# Patient Record
Sex: Female | Born: 1985
Health system: Southern US, Community
[De-identification: ages and names within clinical notes are randomized; demographics above are authoritative.]

## PROBLEM LIST (undated history)

## (undated) DIAGNOSIS — I10 Essential (primary) hypertension: Secondary | ICD-10-CM

---
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
## 2007-04-02 ENCOUNTER — Ambulatory Visit: Payer: Self-pay

## 2007-05-23 ENCOUNTER — Ambulatory Visit: Payer: Self-pay | Admitting: Obstetrics and Gynecology

## 2007-06-16 ENCOUNTER — Emergency Department: Payer: Self-pay | Admitting: Emergency Medicine

## 2007-06-16 ENCOUNTER — Other Ambulatory Visit: Payer: Self-pay

## 2007-09-04 ENCOUNTER — Inpatient Hospital Stay: Payer: Self-pay | Admitting: Psychiatry

## 2007-09-06 ENCOUNTER — Other Ambulatory Visit: Payer: Self-pay

## 2008-05-11 ENCOUNTER — Emergency Department: Payer: Self-pay | Admitting: Emergency Medicine

## 2008-05-21 ENCOUNTER — Emergency Department: Payer: Self-pay | Admitting: Emergency Medicine

## 2008-05-30 ENCOUNTER — Emergency Department: Payer: Self-pay | Admitting: Emergency Medicine

## 2008-07-22 ENCOUNTER — Ambulatory Visit: Payer: Self-pay

## 2009-06-28 ENCOUNTER — Emergency Department: Payer: Self-pay | Admitting: Emergency Medicine

## 2009-07-05 ENCOUNTER — Emergency Department: Payer: Self-pay | Admitting: Emergency Medicine

## 2014-06-06 ENCOUNTER — Emergency Department: Payer: Self-pay | Admitting: Emergency Medicine

## 2014-06-06 LAB — COMPREHENSIVE METABOLIC PANEL
AST: 18 U/L (ref 15–37)
Albumin: 3.2 g/dL — ABNORMAL LOW (ref 3.4–5.0)
Alkaline Phosphatase: 95 U/L
Anion Gap: 7 (ref 7–16)
BILIRUBIN TOTAL: 0.4 mg/dL (ref 0.2–1.0)
BUN: 9 mg/dL (ref 7–18)
CHLORIDE: 107 mmol/L (ref 98–107)
CO2: 24 mmol/L (ref 21–32)
CREATININE: 0.79 mg/dL (ref 0.60–1.30)
Calcium, Total: 8.2 mg/dL — ABNORMAL LOW (ref 8.5–10.1)
EGFR (African American): >60
EGFR (Non-African Amer.): >60
GLUCOSE: 93 mg/dL (ref 65–99)
Osmolality: 274 (ref 275–301)
Potassium: 3.8 mmol/L (ref 3.5–5.1)
SGPT (ALT): 18 U/L
Sodium: 138 mmol/L (ref 136–145)
TOTAL PROTEIN: 7.6 g/dL (ref 6.4–8.2)

## 2014-06-06 LAB — CBC WITH DIFFERENTIAL/PLATELET
BASOS ABS: 0.1 10*3/uL (ref 0.0–0.1)
Basophil %: 1 %
Eosinophil #: 0.3 10*3/uL (ref 0.0–0.7)
Eosinophil %: 3.4 %
HCT: 40.5 % (ref 35.0–47.0)
HGB: 13 g/dL (ref 12.0–16.0)
Lymphocyte #: 2.3 10*3/uL (ref 1.0–3.6)
Lymphocyte %: 24.5 %
MCH: 24.9 pg — ABNORMAL LOW (ref 26.0–34.0)
MCHC: 32 g/dL (ref 32.0–36.0)
MCV: 78 fL — AB (ref 80–100)
MONOS PCT: 7.6 %
Monocyte #: 0.7 x10 3/mm (ref 0.2–0.9)
Neutrophil #: 6 10*3/uL (ref 1.4–6.5)
Neutrophil %: 63.5 %
PLATELETS: 324 10*3/uL (ref 150–440)
RBC: 5.21 10*6/uL — ABNORMAL HIGH (ref 3.80–5.20)
RDW: 14.7 % — AB (ref 11.5–14.5)
WBC: 9.5 10*3/uL (ref 3.6–11.0)

## 2014-06-06 LAB — TROPONIN I

## 2014-06-06 LAB — URINALYSIS, COMPLETE
BILIRUBIN, UR: NEGATIVE
Glucose,UR: NEGATIVE mg/dL (ref 0–75)
Ketone: NEGATIVE
LEUKOCYTE ESTERASE: NEGATIVE
Nitrite: NEGATIVE
PH: 7 (ref 4.5–8.0)
PROTEIN: NEGATIVE
RBC,UR: 2 /HPF (ref 0–5)
Specific Gravity: 1.015 (ref 1.003–1.030)
Squamous Epithelial: 9
WBC UR: 1 /HPF (ref 0–5)

## 2015-01-19 ENCOUNTER — Emergency Department
Admission: EM | Admit: 2015-01-19 | Discharge: 2015-01-19 | Disposition: A | Discharge status: Home / Self Care | Payer: Self-pay | Attending: Emergency Medicine | Admitting: Emergency Medicine

## 2015-01-19 ENCOUNTER — Emergency Department: Payer: Self-pay

## 2015-01-19 ENCOUNTER — Encounter: Payer: Self-pay | Admitting: Urgent Care

## 2015-01-19 DIAGNOSIS — Z720 Tobacco use: Secondary | ICD-10-CM | POA: Insufficient documentation

## 2015-01-19 DIAGNOSIS — Z3202 Encounter for pregnancy test, result negative: Secondary | ICD-10-CM | POA: Insufficient documentation

## 2015-01-19 DIAGNOSIS — M545 Low back pain, unspecified: Secondary | ICD-10-CM

## 2015-01-19 HISTORY — DX: Essential (primary) hypertension: I10

## 2015-01-19 LAB — URINALYSIS COMPLETE WITH MICROSCOPIC (ARMC ONLY)
Bilirubin Urine: NEGATIVE
Glucose, UA: NEGATIVE mg/dL
Hgb urine dipstick: NEGATIVE
Ketones, ur: NEGATIVE mg/dL
LEUKOCYTES UA: NEGATIVE
Nitrite: NEGATIVE
PH: 5 (ref 5.0–8.0)
PROTEIN: 30 mg/dL — AB
Specific Gravity, Urine: 1.029 (ref 1.005–1.030)

## 2015-01-19 LAB — POCT PREGNANCY, URINE: PREG TEST UR: NEGATIVE

## 2015-01-19 MED ORDER — KETOROLAC TROMETHAMINE 60 MG/2ML IM SOLN
60.0000 mg | Freq: Once | INTRAMUSCULAR | Status: AC
  Administered 2015-01-19: 60 mg via INTRAMUSCULAR

## 2015-01-19 MED ORDER — KETOROLAC TROMETHAMINE 60 MG/2ML IM SOLN
INTRAMUSCULAR | Status: AC
  Administered 2015-01-19: 60 mg via INTRAMUSCULAR
  Filled 2015-01-19: qty 2

## 2015-01-19 MED ORDER — CYCLOBENZAPRINE HCL 10 MG PO TABS: 10.0000 mg | ORAL_TABLET | Freq: Three times a day (TID) | ORAL | Status: AC | PRN

## 2015-01-19 NOTE — ED Notes (Signed)
Patient presents with complaining of left lower back pain with radiation into flank and groin area. Denies dysuria or hematuria.  Pt reports having recurrent UTIs and these symptoms feel similar. Pt is AOx4 in no apparent distress.

## 2015-01-19 NOTE — ED Provider Notes (Signed)
[facility] Emergency Department Provider Note  ____________________________________________  Time seen: Approximately 506 AM  I have reviewed the triage vital signs and the nursing notes.   HISTORY  Chief Complaint Back Pain    HPI ALMYRA BIRMAN is a 29 y.o. female comes in with back pain for 2 days. The patient reports that his lower back and seems to radiate up into her left side. The patient reports that she thinks she has a kidney infection because the time she's had this in the past was with a kidney infection. The patient has not been taking anything for pain because she does not like medicine. The patient denies pain with urination and reports her pain is 8 out of 10 in intensity. Her ports that she does not think that this pain is something that can be treated with ibuprofen so she has not tried any ibuprofen for pain. The patient came in for further evaluation.   Past Medical History  Diagnosis Date  . Hypertension     There are no active problems to display for this patient.   History reviewed. No pertinent past surgical history.  Current Outpatient Rx  Name  Route  Sig  Dispense  Refill  . cyclobenzaprine (FLEXERIL) 10 MG tablet   Oral   Take 1 tablet (10 mg total) by mouth every 8 (eight) hours as needed for muscle spasms.   15 tablet   0     Allergies Review of patient's allergies indicates no known allergies.  No family history on file.  Social History History  Substance Use Topics  . Smoking status: Current Every Day Smoker  . Smokeless tobacco: Not on file  . Alcohol Use: Yes    Review of Systems Constitutional: No fever/chills Eyes: No visual changes. ENT: No sore throat. Cardiovascular: Denies chest pain. Respiratory: Denies shortness of breath. Gastrointestinal: No abdominal pain.  No nausea, no vomiting.   Genitourinary: Negative for dysuria. Musculoskeletal: back pain. Skin: Negative for rash. Neurological:  Negative for headaches,  10-point ROS otherwise negative.  ____________________________________________   PHYSICAL EXAM:  VITAL SIGNS: ED Triage Vitals  Enc Vitals Group     BP 01/19/15 0113 133/88 mmHg     Pulse Rate 01/19/15 0113 98     Resp 01/19/15 0113 18     Temp 01/19/15 0113 98.3 F (36.8 C)     Temp Source 01/19/15 0113 Oral     SpO2 01/19/15 0113 98 %     Weight --      Height 01/19/15 0113  (1.575 m)     Head Cir --      Peak Flow --      Pain Score 01/19/15 0113 6     Pain Loc --      Pain Edu? --      Excl. in GC? --     Constitutional: Alert and oriented. Well appearing and in mild distress. Eyes: Conjunctivae are normal. PERRL. EOMI. Head: Atraumatic. Nose: No congestion/rhinnorhea. Mouth/Throat: Mucous membranes are moist.  Oropharynx non-erythematous. Cardiovascular: Normal rate, regular rhythm. Grossly normal heart sounds.  Good peripheral circulation. Respiratory: Normal respiratory effort.  No retractions. Lungs CTAB. Gastrointestinal: Soft and nontender. No distention. Positive bowel sounds left-sided CVA tenderness palpation. Genitourinary: Deferred Musculoskeletal: No lower extremity tenderness nor edema.   Neurologic:  Normal speech and language. No gross focal neurologic deficits are appreciated.  Skin:  Skin is warm, dry and intact. No rash noted. Psychiatric: Mood and affect are normal.  Speech and behavior are normal.  ____________________________________________   LABS (all labs ordered are listed, but only abnormal results are displayed)  Labs Reviewed  URINALYSIS COMPLETEWITH MICROSCOPIC ([facility] ONLY) - Abnormal; Notable for the following:    Color, Urine YELLOW (*)    APPearance CLEAR (*)    Protein, ur 30 (*)    Bacteria, UA RARE (*)    Squamous Epithelial / LPF 0-5 (*)    All other components within normal limits  URINE CULTURE  POC URINE PREG, ED  POCT PREGNANCY, URINE    ____________________________________________  EKG  None ____________________________________________  RADIOLOGY  CT scan abdomen and pelvis stone protocol: No renal or ureteral stone or obstruction ____________________________________________   PROCEDURES  Procedure(s) performed: None  Critical Care performed: No  ____________________________________________   INITIAL IMPRESSION / ASSESSMENT AND PLAN / ED COURSE  Pertinent labs & imaging results that were available during my care of the patient were reviewed by me and considered in my medical decision making (see chart for details).  This is a 28 year old female who comes in with left-sided flank pain. The patient is concerned that she may have a UTI but the patient does not have a white blood cells and rare bacteria in her urine. The patient also does not have a stone. At this point if the pain is musculoskeletal. The patient did receive a dose of Toradol for pain and reports that the pain is improved. I will discharge the patient home to follow-up with [facility] acute care clinic for further evaluation of her back pain. ____________________________________________   FINAL CLINICAL IMPRESSION(S) / ED DIAGNOSES  Final diagnoses:  Left-sided low back pain without sciatica      Rebecka Apley, MD 01/19/15 [phone]

## 2015-01-19 NOTE — Discharge Instructions (Signed)
Back Pain, Adult Low back pain is very common. About 1 in 5 people have back pain.The cause of low back pain is rarely dangerous. The pain often gets better over time.About half of people with a sudden onset of back pain feel better in just 2 weeks. About 8 in 10 people feel better by 6 weeks.  CAUSES Some common causes of back pain include:  Strain of the muscles or ligaments supporting the spine.  Wear and tear (degeneration) of the spinal discs.  Arthritis.  Direct injury to the back. DIAGNOSIS Most of the time, the direct cause of low back pain is not known.However, back pain can be treated effectively even when the exact cause of the pain is unknown.Answering your caregiver's questions about your overall health and symptoms is one of the most accurate ways to make sure the cause of your pain is not dangerous. If your caregiver needs more information, he or she may order lab work or imaging tests (X-rays or MRIs).However, even if imaging tests show changes in your back, this usually does not require surgery. HOME CARE INSTRUCTIONS For many people, back pain returns.Since low back pain is rarely dangerous, it is often a condition that people can learn to manageon their own.   Remain active. It is stressful on the back to sit or stand in one place. Do not sit, drive, or stand in one place for more than 30 minutes at a time. Take short walks on level surfaces as soon as pain allows.Try to increase the length of time you walk each day.  Do not stay in bed.Resting more than 1 or 2 days can delay your recovery.  Do not avoid exercise or work.Your body is made to move.It is not dangerous to be active, even though your back may hurt.Your back will likely heal faster if you return to being active before your pain is gone.  Pay attention to your body when you bend and lift. Many people have less discomfortwhen lifting if they bend their knees, keep the load close to their bodies,and  avoid twisting. Often, the most comfortable positions are those that put less stress on your recovering back.  Find a comfortable position to sleep. Use a firm mattress and lie on your side with your knees slightly bent. If you lie on your back, put a pillow under your knees.  Only take over-the-counter or prescription medicines as directed by your caregiver. Over-the-counter medicines to reduce pain and inflammation are often the most helpful.Your caregiver may prescribe muscle relaxant drugs.These medicines help dull your pain so you can more quickly return to your normal activities and healthy exercise.  Put ice on the injured area.  Put ice in a plastic bag.  Place a towel between your skin and the bag.  Leave the ice on for 15-20 minutes, 03-04 times a day for the first 2 to 3 days. After that, ice and heat may be alternated to reduce pain and spasms.  Ask your caregiver about trying back exercises and gentle massage. This may be of some benefit.  Avoid feeling anxious or stressed.Stress increases muscle tension and can worsen back pain.It is important to recognize when you are anxious or stressed and learn ways to manage it.Exercise is a great option. SEEK MEDICAL CARE IF:  You have pain that is not relieved with rest or medicine.  You have pain that does not improve in 1 week.  You have new symptoms.  You are generally not feeling well. SEEK   IMMEDIATE MEDICAL CARE IF:   You have pain that radiates from your back into your legs.  You develop new bowel or bladder control problems.  You have unusual weakness or numbness in your arms or legs.  You develop nausea or vomiting.  You develop abdominal pain.  You feel faint. Document Released: 07/23/2005 Document Revised: 01/22/2012 Document Reviewed: 11/24/2013 Exit[employer] Patient Information 2015 [employer]. This information is not intended to replace advice given to you by your health care provider. Make sure you  discuss any questions you have with your health care provider.  

## 2015-01-19 NOTE — ED Notes (Signed)
Patient presents with c/o LEFT lower back pain with (+) radiation into flank and groin area. Denies dysuria. PMH significant for recurrent UTIs - feels similar.

## 2015-01-21 LAB — URINE CULTURE
Culture: 90000
Special Requests: NORMAL

## 2016-10-09 ENCOUNTER — Encounter: Payer: Self-pay | Admitting: Obstetrics and Gynecology

## 2017-05-26 ENCOUNTER — Emergency Department: Payer: Self-pay

## 2017-05-26 ENCOUNTER — Emergency Department
Admission: EM | Admit: 2017-05-26 | Discharge: 2017-05-26 | Disposition: A | Discharge status: Home / Self Care | Payer: Self-pay | Attending: Emergency Medicine | Admitting: Emergency Medicine

## 2017-05-26 ENCOUNTER — Encounter: Payer: Self-pay | Admitting: Emergency Medicine

## 2017-05-26 DIAGNOSIS — I10 Essential (primary) hypertension: Secondary | ICD-10-CM | POA: Insufficient documentation

## 2017-05-26 DIAGNOSIS — N898 Other specified noninflammatory disorders of vagina: Secondary | ICD-10-CM | POA: Insufficient documentation

## 2017-05-26 DIAGNOSIS — L309 Dermatitis, unspecified: Secondary | ICD-10-CM | POA: Insufficient documentation

## 2017-05-26 DIAGNOSIS — R102 Pelvic and perineal pain: Secondary | ICD-10-CM | POA: Insufficient documentation

## 2017-05-26 DIAGNOSIS — F17200 Nicotine dependence, unspecified, uncomplicated: Secondary | ICD-10-CM | POA: Insufficient documentation

## 2017-05-26 LAB — URINALYSIS, COMPLETE (UACMP) WITH MICROSCOPIC
BILIRUBIN URINE: NEGATIVE
Glucose, UA: NEGATIVE mg/dL
KETONES UR: NEGATIVE mg/dL
Leukocytes, UA: NEGATIVE
Nitrite: NEGATIVE
Protein, ur: NEGATIVE mg/dL
Specific Gravity, Urine: 1.023 (ref 1.005–1.030)
pH: 6 (ref 5.0–8.0)

## 2017-05-26 LAB — POCT PREGNANCY, URINE: Preg Test, Ur: NEGATIVE

## 2017-05-26 LAB — WET PREP, GENITAL
Clue Cells Wet Prep HPF POC: NONE SEEN
Sperm: NONE SEEN
TRICH WET PREP: NONE SEEN
YEAST WET PREP: NONE SEEN

## 2017-05-26 LAB — CHLAMYDIA/NGC RT PCR (ARMC ONLY)
Chlamydia Tr: NOT DETECTED
N gonorrhoeae: NOT DETECTED

## 2017-05-26 MED ORDER — TRIAMCINOLONE ACETONIDE 0.1 % EX CREA: 1.0000 "application " | TOPICAL_CREAM | Freq: Two times a day (BID) | CUTANEOUS | 0 refills | Status: DC

## 2017-05-26 NOTE — Discharge Instructions (Signed)
Return to the ER for new or worsening pain, constant pain, fevers, vomiting, weakness, or any other new or worsening symptoms that concern you. Follow-up with your OB/GYN within the next 1-2 weeks.

## 2017-05-26 NOTE — ED Triage Notes (Signed)
Patient presents to the ED with intermittent pelvic pain and increased vaginal discharge/bleeding x 3 months.  Patient reports she has a Mirena IUD and it was supposed to be removed about 1 year ago.  Patient states, "I can't feel my strings, I think it's in the wrong place."  Patient states, "It's like I've been having 2 periods for the last 3 months, one is more brown discharge, and the other is more like a normal period."  Patient also reports chronic eczema and reports it flaring at this time.  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  Patient is in no obvious distress at this time.

## 2017-05-26 NOTE — ED Notes (Signed)
First Nurse Note: Pt states that her eczema is flared up and also that her IUD is misplaced. Pt ambulatory and in NAD at this time. Respirations are equal and unlabored

## 2017-05-26 NOTE — ED Provider Notes (Signed)
[facility] Emergency Department Provider Note ____________________________________________   First MD Initiated Contact with Patient 05/26/17 1124     (approximate)  I have reviewed the triage vital signs and the nursing notes.   HISTORY  Chief Complaint Pelvic Pain; Rash; and Vaginal Discharge    HPI Kelly Sheppard is a 31 y.o. female With history of hypertension who presents with pelvic pain over the last several months, intermittent, somewhat more frequent in the last few weeks, and associated with irregular menses and brownish type bleeding during part of her cycle. Patient also states she can't feel her Mirena string and is concerned that it might be out of place.    Past Medical History:  Diagnosis Date  . Hypertension     There are no active problems to display for this patient.   History reviewed. No pertinent surgical history.  Prior to Admission medications   Not on File    Allergies Patient has no known allergies.  No family history on file.  Social History Social History  Substance Use Topics  . Smoking status: Current Every Day Smoker  . Smokeless tobacco: Never Used  . Alcohol use Yes    Review of Systems  Constitutional: No fever. Eyes: No visual changes. ENT: No neck pain. Cardiovascular: Denies chest pain. Respiratory: Denies shortness of breath. Gastrointestinal: No nausea, no vomiting.   Genitourinary: Negative for dysuria.  Musculoskeletal: Negative for back pain. Skin: Negative for rash. Neurological: Negative for headache.   ____________________________________________   PHYSICAL EXAM:  VITAL SIGNS: ED Triage Vitals  Enc Vitals Group     BP 05/26/17 0929 (!) 146/92     Pulse Rate 05/26/17 0929 (!) 102     Resp 05/26/17 0929 18     Temp 05/26/17 0929 98.9 F (37.2 C)     Temp Source 05/26/17 0929 Oral     SpO2 05/26/17 0929 100 %     Weight 05/26/17 0929 297 lb (134.7 kg)     Height 05/26/17  0929 5\' 1"  (1.549 m)     Head Circumference --      Peak Flow --      Pain Score 05/26/17 0939 4     Pain Loc --      Pain Edu? --      Excl. in GC? --     Constitutional: Alert and oriented. Well appearing and in no acute distress. Eyes: Conjunctivae are normal.  Head: Atraumatic. Nose: No congestion/rhinnorhea. Mouth/Throat: Mucous membranes are moist.   Neck: Normal range of motion.  Cardiovascular: Good peripheral circulation. Respiratory: Normal respiratory effort.  No retractions.  Gastrointestinal: Soft and nontender. No distention.  Genitourinary: No CVA tenderness. Normal external genitalia.  No discharge.  No CMT.  Mild R adnexal discomfort, no L adnexal tenderness.  No masses. IUD string in place.  Musculoskeletal: No lower extremity edema.  Extremities warm and well perfused.  Neurologic:  Normal speech and language. No gross focal neurologic deficits are appreciated.  Skin:  Skin is warm and dry. No rash noted. Psychiatric: Mood and affect are normal. Speech and behavior are normal.  ____________________________________________   LABS (all labs ordered are listed, but only abnormal results are displayed)  Labs Reviewed  WET PREP, GENITAL - Abnormal; Notable for the following:       Result Value   WBC, Wet Prep HPF POC FEW (*)    All other components within normal limits  URINALYSIS, COMPLETE (UACMP) WITH MICROSCOPIC - Abnormal; Notable for  the following:    Color, Urine YELLOW (*)    APPearance HAZY (*)    Hgb urine dipstick MODERATE (*)    Bacteria, UA RARE (*)    Squamous Epithelial / LPF 0-5 (*)    All other components within normal limits  CHLAMYDIA/NGC RT PCR ([facility] ONLY)  POCT PREGNANCY, URINE  POC URINE PREG, ED   ____________________________________________  EKG   ____________________________________________  RADIOLOGY  Pelvic US: no acute findings  ____________________________________________   PROCEDURES  Procedure(s)  performed: No    Critical Care performed: No ____________________________________________   INITIAL IMPRESSION / ASSESSMENT AND PLAN / ED COURSE  Pertinent labs & imaging results that were available during my care of the patient were reviewed by me and considered in my medical decision making (see chart for details).  31 year old female presents with pelvic pain for the last several months, worse over the last several weeks, and associated with intermittent vaginal bleeding and irregular periods. Patient also concerned that her eye T May be out of place. On exam, patient is on antihypertensive, and otherwise normal, she is very well-appearing, and the exam is unremarkable; there is no significant abdominal tenderness, and pelvic is within normal limits. IUD is in place. Differential includes DUB, endometriosis, fibroids, ovarian cyst.  Given patient's well appearance and unremarkable exam, no evidence for torsion.  I will obtain pelvic ultrasound to evaluate for acute causes and reassess.    ----------------------------------------- 2:04 PM on 05/26/2017 -----------------------------------------  Ultrasound is negative. Confirms IUD in the uterus. Patient feels comfortable, and feels well to go home.  Return precautions given.  Instructed to follow up with her ob/gyn.  Will also give cream for eczema.   ____________________________________________   FINAL CLINICAL IMPRESSION(S) / ED DIAGNOSES  Final diagnoses:  Pelvic pain      NEW MEDICATIONS STARTED DURING THIS VISIT:  New Prescriptions   No medications on file     Note:  This document was prepared using Dragon voice recognition software and may include unintentional dictation errors.    Dionne BucySiadecki, Joya Willmott, MD 05/26/17 [phone]

## 2017-05-26 NOTE — ED Notes (Signed)
Patient transported to Ultrasound 

## 2017-12-06 ENCOUNTER — Other Ambulatory Visit: Payer: Self-pay

## 2017-12-06 ENCOUNTER — Emergency Department
Admission: EM | Admit: 2017-12-06 | Discharge: 2017-12-06 | Disposition: A | Discharge status: Home / Self Care | Payer: Self-pay | Attending: Emergency Medicine | Admitting: Emergency Medicine

## 2017-12-06 ENCOUNTER — Encounter: Payer: Self-pay | Admitting: Emergency Medicine

## 2017-12-06 DIAGNOSIS — I10 Essential (primary) hypertension: Secondary | ICD-10-CM | POA: Insufficient documentation

## 2017-12-06 DIAGNOSIS — F17210 Nicotine dependence, cigarettes, uncomplicated: Secondary | ICD-10-CM | POA: Insufficient documentation

## 2017-12-06 DIAGNOSIS — Z91040 Latex allergy status: Secondary | ICD-10-CM | POA: Insufficient documentation

## 2017-12-06 DIAGNOSIS — H1031 Unspecified acute conjunctivitis, right eye: Secondary | ICD-10-CM | POA: Insufficient documentation

## 2017-12-06 DIAGNOSIS — B309 Viral conjunctivitis, unspecified: Secondary | ICD-10-CM

## 2017-12-06 MED ORDER — ERYTHROMYCIN 5 MG/GM OP OINT: TOPICAL_OINTMENT | Freq: Three times a day (TID) | OPHTHALMIC | 0 refills | Status: AC

## 2017-12-06 NOTE — ED Provider Notes (Signed)
[facility] Emergency Department Provider Note  ____________________________________________  Time seen: Approximately 6:02 AM  I have reviewed the triage vital signs and the nursing notes.   HISTORY  Chief Complaint Eye Problem    HPI Kelly Sheppard is a 32 y.o. female with a past medical history of hypertension complains of right eye redness itching and discharge for the past 3 days. Eyes crusted in the morning when she wakes up. Left eye is starting to be irritated today as well. 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 Denies any other complaints. No sore throat cough shortness of breath chest pain or ear pain. No vision changes. No trauma.      Past Medical History:  Diagnosis Date  . Hypertension      There are no active problems to display for this patient.    History reviewed. No pertinent surgical history.   Prior to Admission medications   Medication Sig Start Date End Date Taking? Authorizing Provider  erythromycin [facility]) ophthalmic ointment Place into the right eye 3 (three) times daily for 7 days. Place a 1/2 inch ribbon of ointment into the lower eyelid. 12/06/17 12/13/17  Sharman Cheek, MD  triamcinolone cream (KENALOG) 0.1 % Apply 1 application topically 2 (two) times daily. 05/26/17   Dionne Bucy, MD     Allergies Latex   No family history on file.  Social History Social History   Tobacco Use  . Smoking status: Current Every Day Smoker  . Smokeless tobacco: Never Used  Substance Use Topics  . Alcohol use: Yes  . Drug use: No    Review of Systems  Constitutional:   No fever or chills.  ENT:   No sore throat. No rhinorrhea. Cardiovascular:   No chest pain or syncope. Respiratory:   No dyspnea or cough. Gastrointestinal:   Negative for abdominal pain, vomiting and diarrhea.  All other systems reviewed and are negative except as documented above in ROS and  HPI.  ____________________________________________   PHYSICAL EXAM:  VITAL SIGNS: ED Triage Vitals  Enc Vitals Group     BP 12/06/17 0224 (!) 143/85     Pulse Rate 12/06/17 0224 87     Resp 12/06/17 0224 20     Temp 12/06/17 0224 97.7 F (36.5 C)     Temp Source 12/06/17 0224 Oral     SpO2 12/06/17 0224 100 %     Weight 12/06/17 0223 297 lb (134.7 kg)     Height 12/06/17 0223  (1.575 m)     Head Circumference --      Peak Flow --      Pain Score 12/06/17 0222 0     Pain Loc --      Pain Edu? --      Excl. in GC? --     Vital signs reviewed, nursing assessments reviewed.   Constitutional:   Alert and oriented. Well appearing and in no distress. Eyes:   hazy conjunctival erythema over bilateral sclera, right greater than left. Tarsal conjunctiva is also injected.there is crusty discharge in the right eyelashes ENT      Head:   Normocephalic and atraumatic.      Nose:   No congestion/rhinnorhea.       Mouth/Throat:   MMM, no pharyngeal erythema. No peritonsillar mass.       Neck:   No meningismus. Full ROM. Hematological/Lymphatic/Immunilogical:   No cervical lymphadenopathy. Cardiovascular:   RRR. Symmetric bilateral  radial and DP pulses.  No murmurs.  Respiratory:   Normal respiratory effort without tachypnea/retractions. Breath sounds are clear and equal bilaterally. No wheezes/rales/rhonchi. Gastrointestinal:   Soft and nontender. Non distended. There is no CVA tenderness.  No rebound, rigidity, or guarding. Neurologic:   Normal speech and language.  Motor grossly intact. No acute focal neurologic deficits are appreciated.    ____________________________________________    LABS (pertinent positives/negatives) (all labs ordered are listed, but only abnormal results are displayed) Labs Reviewed - No data to display ____________________________________________   EKG    ____________________________________________    RADIOLOGY  No results  found.  ____________________________________________   PROCEDURES Procedures  ____________________________________________    CLINICAL IMPRESSION / ASSESSMENT AND PLAN / ED COURSE  Pertinent labs & imaging results that were available during my care of the patient were reviewed by me and considered in my medical decision making (see chart for details).    patient presents with right eye itching and discharge. Exam reveals conjunctivitis. Doubt glaucoma, global ischemia, endophthalmitis. No evidence of trauma. Likely viral especially with the emerging bilateral involvement, but since the current pronounced symptoms or unilateral I will treat with erythromycin ointment. Follow-up primary care as needed      ____________________________________________   FINAL CLINICAL IMPRESSION(S) / ED DIAGNOSES    Final diagnoses:  Acute viral conjunctivitis of right eye     ED Discharge Orders        Ordered    erythromycin [facility]) ophthalmic ointment  3 times daily     12/06/17 0602      Portions of this note were generated with dragon dictation software. Dictation errors may occur despite best attempts at proofreading.    Sharman Cheek, MD 12/06/17 [phone]

## 2017-12-06 NOTE — ED Triage Notes (Addendum)
Patient ambulatory to triage with steady gait, without difficulty or distress noted; pt reports right eye redness & drainage x 3 days; denies pain; visual acuity 20/70 bilat; pt st norm wears corrective lenses

## 2018-03-28 ENCOUNTER — Encounter: Payer: Self-pay | Admitting: Emergency Medicine

## 2018-03-28 ENCOUNTER — Emergency Department: Payer: Worker's Compensation

## 2018-03-28 ENCOUNTER — Other Ambulatory Visit: Payer: Self-pay

## 2018-03-28 ENCOUNTER — Emergency Department
Admission: EM | Admit: 2018-03-28 | Discharge: 2018-03-28 | Disposition: A | Discharge status: Home / Self Care | Payer: Worker's Compensation | Attending: Emergency Medicine | Admitting: Emergency Medicine

## 2018-03-28 DIAGNOSIS — M546 Pain in thoracic spine: Secondary | ICD-10-CM | POA: Insufficient documentation

## 2018-03-28 DIAGNOSIS — Y999 Unspecified external cause status: Secondary | ICD-10-CM | POA: Insufficient documentation

## 2018-03-28 DIAGNOSIS — M25561 Pain in right knee: Secondary | ICD-10-CM | POA: Insufficient documentation

## 2018-03-28 DIAGNOSIS — F17200 Nicotine dependence, unspecified, uncomplicated: Secondary | ICD-10-CM | POA: Diagnosis not present

## 2018-03-28 DIAGNOSIS — Y929 Unspecified place or not applicable: Secondary | ICD-10-CM | POA: Diagnosis not present

## 2018-03-28 DIAGNOSIS — M25511 Pain in right shoulder: Secondary | ICD-10-CM | POA: Insufficient documentation

## 2018-03-28 DIAGNOSIS — Y939 Activity, unspecified: Secondary | ICD-10-CM | POA: Diagnosis not present

## 2018-03-28 DIAGNOSIS — M25551 Pain in right hip: Secondary | ICD-10-CM | POA: Diagnosis not present

## 2018-03-28 DIAGNOSIS — I10 Essential (primary) hypertension: Secondary | ICD-10-CM | POA: Insufficient documentation

## 2018-03-28 DIAGNOSIS — M7918 Myalgia, other site: Secondary | ICD-10-CM

## 2018-03-28 LAB — POCT PREGNANCY, URINE: PREG TEST UR: NEGATIVE

## 2018-03-28 MED ORDER — IBUPROFEN 800 MG PO TABS: 800.0000 mg | ORAL_TABLET | Freq: Three times a day (TID) | ORAL | 0 refills | Status: DC | PRN

## 2018-03-28 MED ORDER — TRIAMCINOLONE ACETONIDE 0.1 % EX CREA: 1.0000 "application " | TOPICAL_CREAM | Freq: Two times a day (BID) | CUTANEOUS | 0 refills | Status: DC

## 2018-03-28 NOTE — ED Notes (Signed)
POC Urine pregnancy test result= NEGATIVE
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
## 2018-03-28 NOTE — ED Notes (Signed)
Pt to xray at this time.
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
## 2018-07-08 ENCOUNTER — Emergency Department
Admission: EM | Admit: 2018-07-08 | Discharge: 2018-07-08 | Disposition: A | Discharge status: Home / Self Care | Payer: Self-pay | Attending: Emergency Medicine | Admitting: Emergency Medicine

## 2018-07-08 ENCOUNTER — Emergency Department: Payer: Self-pay

## 2018-07-08 ENCOUNTER — Other Ambulatory Visit: Payer: Self-pay

## 2018-07-08 DIAGNOSIS — Z91040 Latex allergy status: Secondary | ICD-10-CM | POA: Insufficient documentation

## 2018-07-08 DIAGNOSIS — I10 Essential (primary) hypertension: Secondary | ICD-10-CM | POA: Insufficient documentation

## 2018-07-08 DIAGNOSIS — F17200 Nicotine dependence, unspecified, uncomplicated: Secondary | ICD-10-CM | POA: Insufficient documentation

## 2018-07-08 DIAGNOSIS — R079 Chest pain, unspecified: Secondary | ICD-10-CM | POA: Insufficient documentation

## 2018-07-08 DIAGNOSIS — R071 Chest pain on breathing: Secondary | ICD-10-CM | POA: Insufficient documentation

## 2018-07-08 LAB — COMPREHENSIVE METABOLIC PANEL
ALT: 12 U/L (ref 0–44)
ANION GAP: 5 (ref 5–15)
AST: 19 U/L (ref 15–41)
Albumin: 3.5 g/dL (ref 3.5–5.0)
Alkaline Phosphatase: 68 U/L (ref 38–126)
BUN: 17 mg/dL (ref 6–20)
CHLORIDE: 107 mmol/L (ref 98–111)
CO2: 27 mmol/L (ref 22–32)
Calcium: 8.7 mg/dL — ABNORMAL LOW (ref 8.9–10.3)
Creatinine, Ser: 0.81 mg/dL (ref 0.44–1.00)
GFR calc non Af Amer: >60 mL/min (ref >60)
Glucose, Bld: 90 mg/dL (ref 70–99)
Potassium: 4 mmol/L (ref 3.5–5.1)
SODIUM: 139 mmol/L (ref 135–145)
Total Bilirubin: 0.3 mg/dL (ref 0.3–1.2)
Total Protein: 7.1 g/dL (ref 6.5–8.1)

## 2018-07-08 LAB — CBC
HCT: 41.8 % (ref 36.0–46.0)
Hemoglobin: 13.1 g/dL (ref 12.0–15.0)
MCH: 26.4 pg (ref 26.0–34.0)
MCHC: 31.3 g/dL (ref 30.0–36.0)
MCV: 84.1 fL (ref 80.0–100.0)
PLATELETS: 300 10*3/uL (ref 150–400)
RBC: 4.97 MIL/uL (ref 3.87–5.11)
RDW: 14.1 % (ref 11.5–15.5)
WBC: 9.4 10*3/uL (ref 4.0–10.5)
nRBC: 0 % (ref 0.0–0.2)

## 2018-07-08 LAB — TROPONIN I: Troponin I: <0.03 ng/mL (ref <0.03)

## 2018-07-08 MED ORDER — ALUM & MAG HYDROXIDE-SIMETH 200-200-20 MG/5ML PO SUSP
30.0000 mL | Freq: Once | ORAL | Status: DC
  Filled 2018-07-08: qty 30

## 2018-07-08 MED ORDER — IBUPROFEN 400 MG PO TABS
600.0000 mg | ORAL_TABLET | Freq: Once | ORAL | Status: AC
  Administered 2018-07-08: 600 mg via ORAL
  Filled 2018-07-08: qty 2

## 2018-07-08 NOTE — ED Notes (Signed)
Patient transported to X-ray 

## 2018-07-08 NOTE — ED Provider Notes (Signed)
Kelly Sheppard - [facility] Emergency Department Provider Note  ___________________________________________   First MD Initiated Contact with Patient 07/08/18 1928     (approximate)  I have reviewed the triage vital signs and the nursing notes.   HISTORY  Chief Complaint Chest Pain   HPI Bettey Mareierra O Sheppard is a 32 y.o. female with a history of hypertension was presenting to the emergency department today complaining of right-sided lower chest pain that started at approximately 3 PM.  Says that the pain was sharp and severe when it began but has now reduced to a 2-4 out of 10 dull ache.  Says the pain is worse with coughing as well as deep breathing.  However, she says that she has been coughing over the past 4 months.  Says that she had upper respiratory infection several months ago which she says never seemed to go away.  She is concerned for heart attack because she says that she has a strong history of heart disease in her family and her father died at 30 years old from heart disease.  She denies any heavy lifting or bending lately.  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  Does not take any hormone supplementation such as birth control.  Has not tried any medication yet for the pain.   Past Medical History:  Diagnosis Date  . Hypertension     There are no active problems to display for this patient.   No past surgical history on file.  Prior to Admission medications   Medication Sig Start Date End Date Taking? Authorizing Provider  ibuprofen (ADVIL,MOTRIN) 800 MG tablet Take 1 tablet (800 mg total) by mouth every 8 (eight) hours as needed. 03/28/18   Darci CurrentBrown, [place] N, MD  triamcinolone cream (KENALOG) 0.1 % Apply 1 application topically 2 (two) times daily. 03/28/18   Darci CurrentBrown, [place] N, MD    Allergies Latex  No family history on file.  Social History Social History   Tobacco Use  . Smoking status:  Current Every Day Smoker  . Smokeless tobacco: Never Used  Substance Use Topics  . Alcohol use: Yes  . Drug use: No    Review of Systems  Constitutional: No fever/chills Eyes: No visual changes. ENT: No sore throat. Cardiovascular: As above Respiratory: As above Gastrointestinal: No abdominal pain.  No nausea, no vomiting.  No diarrhea.  No constipation. Genitourinary: Negative for dysuria. Musculoskeletal: Negative for back pain. Skin: Negative for rash. Neurological: Negative for headaches, focal weakness or numbness.   ____________________________________________   PHYSICAL EXAM:  VITAL SIGNS: ED Triage Vitals  Enc Vitals Group     BP 07/08/18 1911 (!) 153/94     Pulse Rate 07/08/18 1911 (!) 102     Resp 07/08/18 1911 20     Temp 07/08/18 1911 98.5 F (36.9 C)     Temp Source 07/08/18 1911 Oral     SpO2 07/08/18 1911 100 %     Weight 07/08/18 1912 (!) 314 lb (142.4 kg)     Height 07/08/18 1912 5\' 2"  (1.575 m)     Head Circumference --      Peak Flow --      Pain Score 07/08/18 1912 3     Pain Loc --      Pain Edu? --      Excl. in GC? --     Constitutional: Alert and oriented. Well appearing and in  no acute distress. Eyes: Conjunctivae are normal.  Head: Atraumatic. Nose: No congestion/rhinnorhea. Mouth/Throat: Mucous membranes are moist.  Neck: No stridor.   Cardiovascular: Normal rate, regular rhythm. Grossly normal heart sounds.  Chest pain is reproducible to the right lower chest under the right breast.  There is no crepitus.  No bony deformity or bruising. Respiratory: Normal respiratory effort.  No retractions. Lungs CTAB. Gastrointestinal: Soft and nontender. No distention.  Musculoskeletal: No lower extremity tenderness nor edema.  No joint effusions. Neurologic:  Normal speech and language. No gross focal neurologic deficits are appreciated. Skin:  Skin is warm, dry and intact. No rash noted. Psychiatric: Mood and affect are normal. Speech and  behavior are normal.  ____________________________________________   LABS (all labs ordered are listed, but only abnormal results are displayed)  Labs Reviewed  COMPREHENSIVE METABOLIC PANEL - Abnormal; Notable for the following components:      Result Value   Calcium 8.7 (*)    All other components within normal limits  CBC  TROPONIN I   ____________________________________________  EKG  ED ECG REPORT I, Arelia Longest, the attending physician, personally viewed and interpreted this ECG.   Date: 07/08/2018  EKG Time: 1916  Rate: 98  Rhythm: normal sinus rhythm  Axis: Normal  Intervals:none  ST&T Change: No ST segment elevation or depression.  No abnormal T wave inversion.  ____________________________________________  RADIOLOGY  Chest x-ray without acute process ____________________________________________   PROCEDURES  Procedure(s) performed:   Procedures  Critical Care performed:   ____________________________________________   INITIAL IMPRESSION / ASSESSMENT AND PLAN / ED COURSE  Pertinent labs & imaging results that were available during my care of the patient were reviewed by me and considered in my medical decision making (see chart for details).  Differential diagnosis includes, but is not limited to, ACS, aortic dissection, pulmonary embolism, cardiac tamponade, pneumothorax, pneumonia, pericarditis, myocarditis, GI-related causes including esophagitis/gastritis, and musculoskeletal chest wall pain.   As part of my medical decision making, I reviewed the following data within the electronic MEDICAL RECORD NUMBER Notes from prior ED visits  Patient is a low risk per the Wells criteria with a 1.3% chance of PE.  However, the patient's heart rate was retaken and is now 92-97, consistently in the emergency department.  Heart score of 3.  Because the patient's initial heart rate was above 100 I did discuss d-dimer with the patient and possible CAT scan.   However, the patient says that she does not wish to stay for testing.  She says that she is most concerned about heart attack and CHF.  There is no evidence of massive heart attack given the first EKG and troponin and the patient has a normal chest x-ray.  She is aware of the risks of pulmonary embolus being death or permanent disability and she says that she will return for any worsening or concerning symptoms.  She has good insight into her clinical condition and has capacity to make medical decisions.  Patient to be discharged at this time. ____________________________________________   FINAL CLINICAL IMPRESSION(S) / ED DIAGNOSES  Chest pain.  NEW MEDICATIONS STARTED DURING THIS VISIT:  New Prescriptions   No medications on file     Note:  This document was prepared using Dragon voice recognition software and may include unintentional dictation errors.     Myrna Blazer, MD 07/08/18 2036

## 2018-07-08 NOTE — ED Triage Notes (Signed)
Pt in with co chest pain states under breasts bilat, feels shob. Also co cough x 2-3 months has seen pmd for the same and was told she had URI. Hx of asthma, no shob noted at this time.

## 2019-06-08 DIAGNOSIS — H16143 Punctate keratitis, bilateral: Secondary | ICD-10-CM | POA: Diagnosis not present

## 2019-06-10 DIAGNOSIS — H16143 Punctate keratitis, bilateral: Secondary | ICD-10-CM | POA: Diagnosis not present

## 2019-06-12 DIAGNOSIS — H16141 Punctate keratitis, right eye: Secondary | ICD-10-CM | POA: Diagnosis not present

## 2019-06-15 DIAGNOSIS — H16143 Punctate keratitis, bilateral: Secondary | ICD-10-CM | POA: Diagnosis not present

## 2019-06-18 DIAGNOSIS — F331 Major depressive disorder, recurrent, moderate: Secondary | ICD-10-CM | POA: Diagnosis not present

## 2019-06-18 DIAGNOSIS — Z3169 Encounter for other general counseling and advice on procreation: Secondary | ICD-10-CM | POA: Diagnosis not present

## 2019-06-18 DIAGNOSIS — N979 Female infertility, unspecified: Secondary | ICD-10-CM | POA: Diagnosis not present

## 2019-06-18 DIAGNOSIS — Z6841 Body Mass Index (BMI) 40.0 and over, adult: Secondary | ICD-10-CM | POA: Diagnosis not present

## 2019-06-18 DIAGNOSIS — F411 Generalized anxiety disorder: Secondary | ICD-10-CM | POA: Diagnosis not present

## 2019-06-18 DIAGNOSIS — H16143 Punctate keratitis, bilateral: Secondary | ICD-10-CM | POA: Diagnosis not present

## 2019-06-18 DIAGNOSIS — F4310 Post-traumatic stress disorder, unspecified: Secondary | ICD-10-CM | POA: Diagnosis not present

## 2019-06-23 DIAGNOSIS — F1010 Alcohol abuse, uncomplicated: Secondary | ICD-10-CM | POA: Diagnosis not present

## 2019-06-23 DIAGNOSIS — F4310 Post-traumatic stress disorder, unspecified: Secondary | ICD-10-CM | POA: Diagnosis not present

## 2019-06-23 DIAGNOSIS — F411 Generalized anxiety disorder: Secondary | ICD-10-CM | POA: Diagnosis not present

## 2019-06-23 DIAGNOSIS — H16223 Keratoconjunctivitis sicca, not specified as Sjogren's, bilateral: Secondary | ICD-10-CM | POA: Diagnosis not present

## 2019-06-23 DIAGNOSIS — F331 Major depressive disorder, recurrent, moderate: Secondary | ICD-10-CM | POA: Diagnosis not present

## 2019-06-25 DIAGNOSIS — H18599 Other hereditary corneal dystrophies, unspecified eye: Secondary | ICD-10-CM | POA: Diagnosis not present

## 2019-06-30 DIAGNOSIS — F4310 Post-traumatic stress disorder, unspecified: Secondary | ICD-10-CM | POA: Diagnosis not present

## 2019-06-30 DIAGNOSIS — F411 Generalized anxiety disorder: Secondary | ICD-10-CM | POA: Diagnosis not present

## 2019-06-30 DIAGNOSIS — F331 Major depressive disorder, recurrent, moderate: Secondary | ICD-10-CM | POA: Diagnosis not present

## 2019-06-30 DIAGNOSIS — F1010 Alcohol abuse, uncomplicated: Secondary | ICD-10-CM | POA: Diagnosis not present

## 2019-07-09 DIAGNOSIS — F331 Major depressive disorder, recurrent, moderate: Secondary | ICD-10-CM | POA: Diagnosis not present

## 2019-07-09 DIAGNOSIS — G4700 Insomnia, unspecified: Secondary | ICD-10-CM | POA: Diagnosis not present

## 2019-07-09 DIAGNOSIS — H18599 Other hereditary corneal dystrophies, unspecified eye: Secondary | ICD-10-CM | POA: Diagnosis not present

## 2019-07-09 DIAGNOSIS — F411 Generalized anxiety disorder: Secondary | ICD-10-CM | POA: Diagnosis not present

## 2019-07-14 DIAGNOSIS — F411 Generalized anxiety disorder: Secondary | ICD-10-CM | POA: Diagnosis not present

## 2019-07-14 DIAGNOSIS — G4700 Insomnia, unspecified: Secondary | ICD-10-CM | POA: Diagnosis not present

## 2019-07-14 DIAGNOSIS — F331 Major depressive disorder, recurrent, moderate: Secondary | ICD-10-CM | POA: Diagnosis not present

## 2019-07-22 DIAGNOSIS — F411 Generalized anxiety disorder: Secondary | ICD-10-CM | POA: Diagnosis not present

## 2019-07-22 DIAGNOSIS — G4700 Insomnia, unspecified: Secondary | ICD-10-CM | POA: Diagnosis not present

## 2019-07-22 DIAGNOSIS — F331 Major depressive disorder, recurrent, moderate: Secondary | ICD-10-CM | POA: Diagnosis not present

## 2019-07-23 DIAGNOSIS — F4310 Post-traumatic stress disorder, unspecified: Secondary | ICD-10-CM | POA: Diagnosis not present

## 2019-07-23 DIAGNOSIS — F331 Major depressive disorder, recurrent, moderate: Secondary | ICD-10-CM | POA: Diagnosis not present

## 2019-07-23 DIAGNOSIS — G4700 Insomnia, unspecified: Secondary | ICD-10-CM | POA: Diagnosis not present

## 2019-07-23 DIAGNOSIS — F411 Generalized anxiety disorder: Secondary | ICD-10-CM | POA: Diagnosis not present

## 2019-07-28 DIAGNOSIS — F4310 Post-traumatic stress disorder, unspecified: Secondary | ICD-10-CM | POA: Diagnosis not present

## 2019-07-28 DIAGNOSIS — G4700 Insomnia, unspecified: Secondary | ICD-10-CM | POA: Diagnosis not present

## 2019-07-28 DIAGNOSIS — F331 Major depressive disorder, recurrent, moderate: Secondary | ICD-10-CM | POA: Diagnosis not present

## 2019-07-28 DIAGNOSIS — F411 Generalized anxiety disorder: Secondary | ICD-10-CM | POA: Diagnosis not present

## 2019-08-04 DIAGNOSIS — F331 Major depressive disorder, recurrent, moderate: Secondary | ICD-10-CM | POA: Diagnosis not present

## 2019-08-04 DIAGNOSIS — G4700 Insomnia, unspecified: Secondary | ICD-10-CM | POA: Diagnosis not present

## 2019-08-04 DIAGNOSIS — F411 Generalized anxiety disorder: Secondary | ICD-10-CM | POA: Diagnosis not present

## 2019-08-04 DIAGNOSIS — F4310 Post-traumatic stress disorder, unspecified: Secondary | ICD-10-CM | POA: Diagnosis not present

## 2019-08-11 DIAGNOSIS — F411 Generalized anxiety disorder: Secondary | ICD-10-CM | POA: Diagnosis not present

## 2019-08-11 DIAGNOSIS — G4700 Insomnia, unspecified: Secondary | ICD-10-CM | POA: Diagnosis not present

## 2019-08-11 DIAGNOSIS — F4310 Post-traumatic stress disorder, unspecified: Secondary | ICD-10-CM | POA: Diagnosis not present

## 2019-08-11 DIAGNOSIS — F331 Major depressive disorder, recurrent, moderate: Secondary | ICD-10-CM | POA: Diagnosis not present

## 2019-08-18 DIAGNOSIS — F331 Major depressive disorder, recurrent, moderate: Secondary | ICD-10-CM | POA: Diagnosis not present

## 2019-08-18 DIAGNOSIS — G4700 Insomnia, unspecified: Secondary | ICD-10-CM | POA: Diagnosis not present

## 2019-08-18 DIAGNOSIS — F411 Generalized anxiety disorder: Secondary | ICD-10-CM | POA: Diagnosis not present

## 2019-08-18 DIAGNOSIS — F4310 Post-traumatic stress disorder, unspecified: Secondary | ICD-10-CM | POA: Diagnosis not present

## 2019-08-20 DIAGNOSIS — G4700 Insomnia, unspecified: Secondary | ICD-10-CM | POA: Diagnosis not present

## 2019-08-20 DIAGNOSIS — F411 Generalized anxiety disorder: Secondary | ICD-10-CM | POA: Diagnosis not present

## 2019-08-20 DIAGNOSIS — F331 Major depressive disorder, recurrent, moderate: Secondary | ICD-10-CM | POA: Diagnosis not present

## 2019-08-20 DIAGNOSIS — T2660XD Corrosion of cornea and conjunctival sac, unspecified eye, subsequent encounter: Secondary | ICD-10-CM | POA: Diagnosis not present

## 2019-08-20 DIAGNOSIS — F4310 Post-traumatic stress disorder, unspecified: Secondary | ICD-10-CM | POA: Diagnosis not present

## 2019-08-25 DIAGNOSIS — F4310 Post-traumatic stress disorder, unspecified: Secondary | ICD-10-CM | POA: Diagnosis not present

## 2019-08-25 DIAGNOSIS — G4700 Insomnia, unspecified: Secondary | ICD-10-CM | POA: Diagnosis not present

## 2019-08-25 DIAGNOSIS — F411 Generalized anxiety disorder: Secondary | ICD-10-CM | POA: Diagnosis not present

## 2019-08-25 DIAGNOSIS — F331 Major depressive disorder, recurrent, moderate: Secondary | ICD-10-CM | POA: Diagnosis not present

## 2019-09-01 DIAGNOSIS — F4310 Post-traumatic stress disorder, unspecified: Secondary | ICD-10-CM | POA: Diagnosis not present

## 2019-09-01 DIAGNOSIS — F411 Generalized anxiety disorder: Secondary | ICD-10-CM | POA: Diagnosis not present

## 2019-09-01 DIAGNOSIS — F331 Major depressive disorder, recurrent, moderate: Secondary | ICD-10-CM | POA: Diagnosis not present

## 2019-09-01 DIAGNOSIS — G4700 Insomnia, unspecified: Secondary | ICD-10-CM | POA: Diagnosis not present

## 2019-09-04 DIAGNOSIS — Z03818 Encounter for observation for suspected exposure to other biological agents ruled out: Secondary | ICD-10-CM | POA: Diagnosis not present

## 2019-09-04 DIAGNOSIS — Z20828 Contact with and (suspected) exposure to other viral communicable diseases: Secondary | ICD-10-CM | POA: Diagnosis not present

## 2019-09-08 DIAGNOSIS — G4700 Insomnia, unspecified: Secondary | ICD-10-CM | POA: Diagnosis not present

## 2019-09-08 DIAGNOSIS — F331 Major depressive disorder, recurrent, moderate: Secondary | ICD-10-CM | POA: Diagnosis not present

## 2019-09-08 DIAGNOSIS — F411 Generalized anxiety disorder: Secondary | ICD-10-CM | POA: Diagnosis not present

## 2019-09-08 DIAGNOSIS — F4310 Post-traumatic stress disorder, unspecified: Secondary | ICD-10-CM | POA: Diagnosis not present

## 2019-09-09 DIAGNOSIS — T2691XA Corrosion of right eye and adnexa, part unspecified, initial encounter: Secondary | ICD-10-CM | POA: Diagnosis not present

## 2019-09-09 DIAGNOSIS — H18613 Keratoconus, stable, bilateral: Secondary | ICD-10-CM | POA: Diagnosis not present

## 2019-09-09 DIAGNOSIS — Z77098 Contact with and (suspected) exposure to other hazardous, chiefly nonmedicinal, chemicals: Secondary | ICD-10-CM | POA: Diagnosis not present

## 2019-09-15 DIAGNOSIS — F331 Major depressive disorder, recurrent, moderate: Secondary | ICD-10-CM | POA: Diagnosis not present

## 2019-09-15 DIAGNOSIS — F411 Generalized anxiety disorder: Secondary | ICD-10-CM | POA: Diagnosis not present

## 2019-09-15 DIAGNOSIS — F4310 Post-traumatic stress disorder, unspecified: Secondary | ICD-10-CM | POA: Diagnosis not present

## 2019-09-15 DIAGNOSIS — G4700 Insomnia, unspecified: Secondary | ICD-10-CM | POA: Diagnosis not present

## 2019-09-17 DIAGNOSIS — F331 Major depressive disorder, recurrent, moderate: Secondary | ICD-10-CM | POA: Diagnosis not present

## 2019-09-17 DIAGNOSIS — G4700 Insomnia, unspecified: Secondary | ICD-10-CM | POA: Diagnosis not present

## 2019-09-17 DIAGNOSIS — F4310 Post-traumatic stress disorder, unspecified: Secondary | ICD-10-CM | POA: Diagnosis not present

## 2019-09-17 DIAGNOSIS — F411 Generalized anxiety disorder: Secondary | ICD-10-CM | POA: Diagnosis not present

## 2019-09-22 DIAGNOSIS — F4310 Post-traumatic stress disorder, unspecified: Secondary | ICD-10-CM | POA: Diagnosis not present

## 2019-09-22 DIAGNOSIS — G4700 Insomnia, unspecified: Secondary | ICD-10-CM | POA: Diagnosis not present

## 2019-09-22 DIAGNOSIS — F331 Major depressive disorder, recurrent, moderate: Secondary | ICD-10-CM | POA: Diagnosis not present

## 2019-09-22 DIAGNOSIS — F411 Generalized anxiety disorder: Secondary | ICD-10-CM | POA: Diagnosis not present

## 2019-09-24 DIAGNOSIS — F1010 Alcohol abuse, uncomplicated: Secondary | ICD-10-CM | POA: Diagnosis not present

## 2019-09-24 DIAGNOSIS — R635 Abnormal weight gain: Secondary | ICD-10-CM | POA: Diagnosis not present

## 2019-09-24 DIAGNOSIS — Z720 Tobacco use: Secondary | ICD-10-CM | POA: Diagnosis not present

## 2019-09-24 DIAGNOSIS — E282 Polycystic ovarian syndrome: Secondary | ICD-10-CM | POA: Diagnosis not present

## 2019-09-29 DIAGNOSIS — F411 Generalized anxiety disorder: Secondary | ICD-10-CM | POA: Diagnosis not present

## 2019-09-29 DIAGNOSIS — G4700 Insomnia, unspecified: Secondary | ICD-10-CM | POA: Diagnosis not present

## 2019-09-29 DIAGNOSIS — F4310 Post-traumatic stress disorder, unspecified: Secondary | ICD-10-CM | POA: Diagnosis not present

## 2019-09-29 DIAGNOSIS — F331 Major depressive disorder, recurrent, moderate: Secondary | ICD-10-CM | POA: Diagnosis not present

## 2019-10-22 DIAGNOSIS — F331 Major depressive disorder, recurrent, moderate: Secondary | ICD-10-CM | POA: Diagnosis not present

## 2019-10-22 DIAGNOSIS — F411 Generalized anxiety disorder: Secondary | ICD-10-CM | POA: Diagnosis not present

## 2019-10-22 DIAGNOSIS — G4700 Insomnia, unspecified: Secondary | ICD-10-CM | POA: Diagnosis not present

## 2019-10-22 DIAGNOSIS — F4310 Post-traumatic stress disorder, unspecified: Secondary | ICD-10-CM | POA: Diagnosis not present

## 2019-10-27 DIAGNOSIS — F4310 Post-traumatic stress disorder, unspecified: Secondary | ICD-10-CM | POA: Diagnosis not present

## 2019-10-27 DIAGNOSIS — F411 Generalized anxiety disorder: Secondary | ICD-10-CM | POA: Diagnosis not present

## 2019-10-27 DIAGNOSIS — G4700 Insomnia, unspecified: Secondary | ICD-10-CM | POA: Diagnosis not present

## 2019-10-27 DIAGNOSIS — F331 Major depressive disorder, recurrent, moderate: Secondary | ICD-10-CM | POA: Diagnosis not present

## 2019-11-05 DIAGNOSIS — R635 Abnormal weight gain: Secondary | ICD-10-CM | POA: Diagnosis not present

## 2019-11-10 DIAGNOSIS — F331 Major depressive disorder, recurrent, moderate: Secondary | ICD-10-CM | POA: Diagnosis not present

## 2019-11-10 DIAGNOSIS — G4700 Insomnia, unspecified: Secondary | ICD-10-CM | POA: Diagnosis not present

## 2019-11-10 DIAGNOSIS — F411 Generalized anxiety disorder: Secondary | ICD-10-CM | POA: Diagnosis not present

## 2019-11-10 DIAGNOSIS — F4310 Post-traumatic stress disorder, unspecified: Secondary | ICD-10-CM | POA: Diagnosis not present

## 2019-11-11 DIAGNOSIS — H18601 Keratoconus, unspecified, right eye: Secondary | ICD-10-CM | POA: Diagnosis not present

## 2019-11-11 DIAGNOSIS — T2691XD Corrosion of right eye and adnexa, part unspecified, subsequent encounter: Secondary | ICD-10-CM | POA: Diagnosis not present

## 2019-11-17 DIAGNOSIS — F4310 Post-traumatic stress disorder, unspecified: Secondary | ICD-10-CM | POA: Diagnosis not present

## 2019-11-17 DIAGNOSIS — F411 Generalized anxiety disorder: Secondary | ICD-10-CM | POA: Diagnosis not present

## 2019-11-17 DIAGNOSIS — G4700 Insomnia, unspecified: Secondary | ICD-10-CM | POA: Diagnosis not present

## 2019-11-17 DIAGNOSIS — F331 Major depressive disorder, recurrent, moderate: Secondary | ICD-10-CM | POA: Diagnosis not present

## 2019-11-18 DIAGNOSIS — F4310 Post-traumatic stress disorder, unspecified: Secondary | ICD-10-CM | POA: Diagnosis not present

## 2019-11-18 DIAGNOSIS — F331 Major depressive disorder, recurrent, moderate: Secondary | ICD-10-CM | POA: Diagnosis not present

## 2019-11-18 DIAGNOSIS — F411 Generalized anxiety disorder: Secondary | ICD-10-CM | POA: Diagnosis not present

## 2019-11-24 DIAGNOSIS — F411 Generalized anxiety disorder: Secondary | ICD-10-CM | POA: Diagnosis not present

## 2019-11-24 DIAGNOSIS — F331 Major depressive disorder, recurrent, moderate: Secondary | ICD-10-CM | POA: Diagnosis not present

## 2019-11-24 DIAGNOSIS — F4310 Post-traumatic stress disorder, unspecified: Secondary | ICD-10-CM | POA: Diagnosis not present

## 2019-11-30 ENCOUNTER — Other Ambulatory Visit: Payer: Self-pay

## 2019-11-30 ENCOUNTER — Emergency Department: Payer: BC Managed Care – PPO

## 2019-11-30 ENCOUNTER — Encounter: Payer: Self-pay | Admitting: Emergency Medicine

## 2019-11-30 ENCOUNTER — Emergency Department
Admission: EM | Admit: 2019-11-30 | Discharge: 2019-11-30 | Disposition: A | Discharge status: Home / Self Care | Payer: BC Managed Care – PPO | Attending: Emergency Medicine | Admitting: Emergency Medicine

## 2019-11-30 DIAGNOSIS — M542 Cervicalgia: Secondary | ICD-10-CM | POA: Insufficient documentation

## 2019-11-30 DIAGNOSIS — I10 Essential (primary) hypertension: Secondary | ICD-10-CM | POA: Insufficient documentation

## 2019-11-30 DIAGNOSIS — M25511 Pain in right shoulder: Secondary | ICD-10-CM | POA: Insufficient documentation

## 2019-11-30 DIAGNOSIS — Y92410 Unspecified street and highway as the place of occurrence of the external cause: Secondary | ICD-10-CM | POA: Insufficient documentation

## 2019-11-30 DIAGNOSIS — Y9389 Activity, other specified: Secondary | ICD-10-CM | POA: Diagnosis not present

## 2019-11-30 DIAGNOSIS — M545 Low back pain: Secondary | ICD-10-CM | POA: Diagnosis not present

## 2019-11-30 DIAGNOSIS — F17200 Nicotine dependence, unspecified, uncomplicated: Secondary | ICD-10-CM | POA: Diagnosis not present

## 2019-11-30 DIAGNOSIS — Z91040 Latex allergy status: Secondary | ICD-10-CM | POA: Insufficient documentation

## 2019-11-30 DIAGNOSIS — M25512 Pain in left shoulder: Secondary | ICD-10-CM | POA: Diagnosis not present

## 2019-11-30 DIAGNOSIS — Y998 Other external cause status: Secondary | ICD-10-CM | POA: Insufficient documentation

## 2019-11-30 MED ORDER — KETOROLAC TROMETHAMINE 30 MG/ML IJ SOLN
30.0000 mg | Freq: Once | INTRAMUSCULAR | Status: AC
  Administered 2019-11-30: 30 mg via INTRAMUSCULAR
  Filled 2019-11-30: qty 1

## 2019-11-30 MED ORDER — CYCLOBENZAPRINE HCL 5 MG PO TABS: ORAL_TABLET | ORAL | 0 refills | Status: DC

## 2019-11-30 MED ORDER — IBUPROFEN 600 MG PO TABS: 600.0000 mg | ORAL_TABLET | Freq: Four times a day (QID) | ORAL | 0 refills | Status: DC | PRN

## 2019-11-30 MED ORDER — METHOCARBAMOL 500 MG PO TABS
1000.0000 mg | ORAL_TABLET | Freq: Once | ORAL | Status: AC
  Administered 2019-11-30: 1000 mg via ORAL
  Filled 2019-11-30: qty 2
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
## 2019-12-01 DIAGNOSIS — F411 Generalized anxiety disorder: Secondary | ICD-10-CM | POA: Diagnosis not present

## 2019-12-01 DIAGNOSIS — F4310 Post-traumatic stress disorder, unspecified: Secondary | ICD-10-CM | POA: Diagnosis not present

## 2019-12-01 DIAGNOSIS — F331 Major depressive disorder, recurrent, moderate: Secondary | ICD-10-CM | POA: Diagnosis not present

## 2019-12-16 DIAGNOSIS — Z03818 Encounter for observation for suspected exposure to other biological agents ruled out: Secondary | ICD-10-CM | POA: Diagnosis not present

## 2019-12-16 DIAGNOSIS — Z20828 Contact with and (suspected) exposure to other viral communicable diseases: Secondary | ICD-10-CM | POA: Diagnosis not present

## 2019-12-17 DIAGNOSIS — F331 Major depressive disorder, recurrent, moderate: Secondary | ICD-10-CM | POA: Diagnosis not present

## 2019-12-17 DIAGNOSIS — F4310 Post-traumatic stress disorder, unspecified: Secondary | ICD-10-CM | POA: Diagnosis not present

## 2019-12-17 DIAGNOSIS — F411 Generalized anxiety disorder: Secondary | ICD-10-CM | POA: Diagnosis not present

## 2019-12-22 DIAGNOSIS — F4310 Post-traumatic stress disorder, unspecified: Secondary | ICD-10-CM | POA: Diagnosis not present

## 2019-12-22 DIAGNOSIS — F411 Generalized anxiety disorder: Secondary | ICD-10-CM | POA: Diagnosis not present

## 2019-12-22 DIAGNOSIS — F331 Major depressive disorder, recurrent, moderate: Secondary | ICD-10-CM | POA: Diagnosis not present

## 2019-12-29 DIAGNOSIS — F331 Major depressive disorder, recurrent, moderate: Secondary | ICD-10-CM | POA: Diagnosis not present

## 2019-12-29 DIAGNOSIS — F411 Generalized anxiety disorder: Secondary | ICD-10-CM | POA: Diagnosis not present

## 2019-12-31 DIAGNOSIS — H35371 Puckering of macula, right eye: Secondary | ICD-10-CM | POA: Diagnosis not present

## 2019-12-31 DIAGNOSIS — H18613 Keratoconus, stable, bilateral: Secondary | ICD-10-CM | POA: Diagnosis not present

## 2019-12-31 DIAGNOSIS — T2691XD Corrosion of right eye and adnexa, part unspecified, subsequent encounter: Secondary | ICD-10-CM | POA: Diagnosis not present

## 2019-12-31 DIAGNOSIS — Z77098 Contact with and (suspected) exposure to other hazardous, chiefly nonmedicinal, chemicals: Secondary | ICD-10-CM | POA: Diagnosis not present

## 2020-01-05 DIAGNOSIS — F331 Major depressive disorder, recurrent, moderate: Secondary | ICD-10-CM | POA: Diagnosis not present

## 2020-01-05 DIAGNOSIS — F411 Generalized anxiety disorder: Secondary | ICD-10-CM | POA: Diagnosis not present

## 2020-01-12 DIAGNOSIS — F331 Major depressive disorder, recurrent, moderate: Secondary | ICD-10-CM | POA: Diagnosis not present

## 2020-01-12 DIAGNOSIS — F411 Generalized anxiety disorder: Secondary | ICD-10-CM | POA: Diagnosis not present

## 2020-01-13 DIAGNOSIS — F331 Major depressive disorder, recurrent, moderate: Secondary | ICD-10-CM | POA: Diagnosis not present

## 2020-01-13 DIAGNOSIS — F411 Generalized anxiety disorder: Secondary | ICD-10-CM | POA: Diagnosis not present

## 2020-01-19 DIAGNOSIS — F411 Generalized anxiety disorder: Secondary | ICD-10-CM | POA: Diagnosis not present

## 2020-01-19 DIAGNOSIS — F331 Major depressive disorder, recurrent, moderate: Secondary | ICD-10-CM | POA: Diagnosis not present

## 2020-01-21 DIAGNOSIS — R635 Abnormal weight gain: Secondary | ICD-10-CM | POA: Diagnosis not present

## 2020-01-26 DIAGNOSIS — F331 Major depressive disorder, recurrent, moderate: Secondary | ICD-10-CM | POA: Diagnosis not present

## 2020-01-26 DIAGNOSIS — F411 Generalized anxiety disorder: Secondary | ICD-10-CM | POA: Diagnosis not present

## 2020-02-02 DIAGNOSIS — F4310 Post-traumatic stress disorder, unspecified: Secondary | ICD-10-CM | POA: Diagnosis not present

## 2020-02-02 DIAGNOSIS — F411 Generalized anxiety disorder: Secondary | ICD-10-CM | POA: Diagnosis not present

## 2020-02-02 DIAGNOSIS — M25511 Pain in right shoulder: Secondary | ICD-10-CM | POA: Diagnosis not present

## 2020-02-02 DIAGNOSIS — G4700 Insomnia, unspecified: Secondary | ICD-10-CM | POA: Diagnosis not present

## 2020-02-02 DIAGNOSIS — F331 Major depressive disorder, recurrent, moderate: Secondary | ICD-10-CM | POA: Diagnosis not present

## 2020-02-09 DIAGNOSIS — F411 Generalized anxiety disorder: Secondary | ICD-10-CM | POA: Diagnosis not present

## 2020-02-09 DIAGNOSIS — F331 Major depressive disorder, recurrent, moderate: Secondary | ICD-10-CM | POA: Diagnosis not present

## 2020-02-16 DIAGNOSIS — F411 Generalized anxiety disorder: Secondary | ICD-10-CM | POA: Diagnosis not present

## 2020-02-16 DIAGNOSIS — M25511 Pain in right shoulder: Secondary | ICD-10-CM | POA: Diagnosis not present

## 2020-02-16 DIAGNOSIS — G4700 Insomnia, unspecified: Secondary | ICD-10-CM | POA: Diagnosis not present

## 2020-02-16 DIAGNOSIS — F331 Major depressive disorder, recurrent, moderate: Secondary | ICD-10-CM | POA: Diagnosis not present

## 2020-02-16 DIAGNOSIS — F4310 Post-traumatic stress disorder, unspecified: Secondary | ICD-10-CM | POA: Diagnosis not present

## 2020-02-18 DIAGNOSIS — G4700 Insomnia, unspecified: Secondary | ICD-10-CM | POA: Diagnosis not present

## 2020-02-18 DIAGNOSIS — F331 Major depressive disorder, recurrent, moderate: Secondary | ICD-10-CM | POA: Diagnosis not present

## 2020-02-18 DIAGNOSIS — F4310 Post-traumatic stress disorder, unspecified: Secondary | ICD-10-CM | POA: Diagnosis not present

## 2020-02-18 DIAGNOSIS — F411 Generalized anxiety disorder: Secondary | ICD-10-CM | POA: Diagnosis not present

## 2020-02-19 DIAGNOSIS — M25511 Pain in right shoulder: Secondary | ICD-10-CM | POA: Diagnosis not present

## 2020-02-22 DIAGNOSIS — E559 Vitamin D deficiency, unspecified: Secondary | ICD-10-CM | POA: Diagnosis not present

## 2020-02-22 DIAGNOSIS — D509 Iron deficiency anemia, unspecified: Secondary | ICD-10-CM | POA: Diagnosis not present

## 2020-02-22 DIAGNOSIS — Z6841 Body Mass Index (BMI) 40.0 and over, adult: Secondary | ICD-10-CM | POA: Diagnosis not present

## 2020-02-22 DIAGNOSIS — N951 Menopausal and female climacteric states: Secondary | ICD-10-CM | POA: Diagnosis not present

## 2020-02-23 DIAGNOSIS — F4310 Post-traumatic stress disorder, unspecified: Secondary | ICD-10-CM | POA: Diagnosis not present

## 2020-02-23 DIAGNOSIS — G4700 Insomnia, unspecified: Secondary | ICD-10-CM | POA: Diagnosis not present

## 2020-02-23 DIAGNOSIS — M25511 Pain in right shoulder: Secondary | ICD-10-CM | POA: Diagnosis not present

## 2020-02-23 DIAGNOSIS — F331 Major depressive disorder, recurrent, moderate: Secondary | ICD-10-CM | POA: Diagnosis not present

## 2020-02-23 DIAGNOSIS — F411 Generalized anxiety disorder: Secondary | ICD-10-CM | POA: Diagnosis not present

## 2020-02-26 DIAGNOSIS — M25511 Pain in right shoulder: Secondary | ICD-10-CM | POA: Diagnosis not present

## 2020-02-29 DIAGNOSIS — H35033 Hypertensive retinopathy, bilateral: Secondary | ICD-10-CM | POA: Diagnosis not present

## 2020-02-29 DIAGNOSIS — H18613 Keratoconus, stable, bilateral: Secondary | ICD-10-CM | POA: Diagnosis not present

## 2020-02-29 DIAGNOSIS — H35363 Drusen (degenerative) of macula, bilateral: Secondary | ICD-10-CM | POA: Diagnosis not present

## 2020-03-01 ENCOUNTER — Other Ambulatory Visit: Payer: Self-pay | Admitting: Orthopaedic Surgery

## 2020-03-01 DIAGNOSIS — F331 Major depressive disorder, recurrent, moderate: Secondary | ICD-10-CM | POA: Diagnosis not present

## 2020-03-01 DIAGNOSIS — F900 Attention-deficit hyperactivity disorder, predominantly inattentive type: Secondary | ICD-10-CM | POA: Diagnosis not present

## 2020-03-01 DIAGNOSIS — F4310 Post-traumatic stress disorder, unspecified: Secondary | ICD-10-CM | POA: Diagnosis not present

## 2020-03-01 DIAGNOSIS — M25511 Pain in right shoulder: Secondary | ICD-10-CM

## 2020-03-01 DIAGNOSIS — Z1339 Encounter for screening examination for other mental health and behavioral disorders: Secondary | ICD-10-CM | POA: Diagnosis not present

## 2020-03-01 DIAGNOSIS — F411 Generalized anxiety disorder: Secondary | ICD-10-CM | POA: Diagnosis not present

## 2020-03-01 DIAGNOSIS — N951 Menopausal and female climacteric states: Secondary | ICD-10-CM | POA: Diagnosis not present

## 2020-03-01 DIAGNOSIS — Z1331 Encounter for screening for depression: Secondary | ICD-10-CM | POA: Diagnosis not present

## 2020-03-01 DIAGNOSIS — R635 Abnormal weight gain: Secondary | ICD-10-CM | POA: Diagnosis not present

## 2020-03-02 DIAGNOSIS — M25511 Pain in right shoulder: Secondary | ICD-10-CM | POA: Diagnosis not present

## 2020-03-08 DIAGNOSIS — G4700 Insomnia, unspecified: Secondary | ICD-10-CM | POA: Diagnosis not present

## 2020-03-08 DIAGNOSIS — F411 Generalized anxiety disorder: Secondary | ICD-10-CM | POA: Diagnosis not present

## 2020-03-08 DIAGNOSIS — F4310 Post-traumatic stress disorder, unspecified: Secondary | ICD-10-CM | POA: Diagnosis not present

## 2020-03-08 DIAGNOSIS — F331 Major depressive disorder, recurrent, moderate: Secondary | ICD-10-CM | POA: Diagnosis not present

## 2020-03-11 DIAGNOSIS — M25511 Pain in right shoulder: Secondary | ICD-10-CM | POA: Diagnosis not present

## 2020-03-14 DIAGNOSIS — M25511 Pain in right shoulder: Secondary | ICD-10-CM | POA: Diagnosis not present

## 2020-03-15 DIAGNOSIS — Z6841 Body Mass Index (BMI) 40.0 and over, adult: Secondary | ICD-10-CM | POA: Diagnosis not present

## 2020-03-15 DIAGNOSIS — F331 Major depressive disorder, recurrent, moderate: Secondary | ICD-10-CM | POA: Diagnosis not present

## 2020-03-15 DIAGNOSIS — E559 Vitamin D deficiency, unspecified: Secondary | ICD-10-CM | POA: Diagnosis not present

## 2020-03-15 DIAGNOSIS — F411 Generalized anxiety disorder: Secondary | ICD-10-CM | POA: Diagnosis not present

## 2020-03-15 DIAGNOSIS — F4310 Post-traumatic stress disorder, unspecified: Secondary | ICD-10-CM | POA: Diagnosis not present

## 2020-03-15 DIAGNOSIS — F900 Attention-deficit hyperactivity disorder, predominantly inattentive type: Secondary | ICD-10-CM | POA: Diagnosis not present

## 2020-03-18 DIAGNOSIS — M25511 Pain in right shoulder: Secondary | ICD-10-CM | POA: Diagnosis not present

## 2020-03-21 DIAGNOSIS — M25511 Pain in right shoulder: Secondary | ICD-10-CM | POA: Diagnosis not present

## 2020-03-22 DIAGNOSIS — F4310 Post-traumatic stress disorder, unspecified: Secondary | ICD-10-CM | POA: Diagnosis not present

## 2020-03-22 DIAGNOSIS — F411 Generalized anxiety disorder: Secondary | ICD-10-CM | POA: Diagnosis not present

## 2020-03-22 DIAGNOSIS — F331 Major depressive disorder, recurrent, moderate: Secondary | ICD-10-CM | POA: Diagnosis not present

## 2020-03-22 DIAGNOSIS — F900 Attention-deficit hyperactivity disorder, predominantly inattentive type: Secondary | ICD-10-CM | POA: Diagnosis not present

## 2020-03-24 DIAGNOSIS — N951 Menopausal and female climacteric states: Secondary | ICD-10-CM | POA: Diagnosis not present

## 2020-03-24 DIAGNOSIS — D509 Iron deficiency anemia, unspecified: Secondary | ICD-10-CM | POA: Diagnosis not present

## 2020-03-24 DIAGNOSIS — Z8742 Personal history of other diseases of the female genital tract: Secondary | ICD-10-CM | POA: Diagnosis not present

## 2020-03-25 ENCOUNTER — Other Ambulatory Visit: Payer: Self-pay

## 2020-03-25 ENCOUNTER — Ambulatory Visit
Admission: RE | Admit: 2020-03-25 | Discharge: 2020-03-25 | Disposition: A | Discharge status: Home / Self Care | Payer: BC Managed Care – PPO | Source: Ambulatory Visit | Attending: Orthopaedic Surgery | Admitting: Orthopaedic Surgery

## 2020-03-25 DIAGNOSIS — M25511 Pain in right shoulder: Secondary | ICD-10-CM

## 2020-03-29 DIAGNOSIS — F331 Major depressive disorder, recurrent, moderate: Secondary | ICD-10-CM | POA: Diagnosis not present

## 2020-03-29 DIAGNOSIS — F411 Generalized anxiety disorder: Secondary | ICD-10-CM | POA: Diagnosis not present

## 2020-03-29 DIAGNOSIS — F1010 Alcohol abuse, uncomplicated: Secondary | ICD-10-CM | POA: Diagnosis not present

## 2020-03-29 DIAGNOSIS — F4310 Post-traumatic stress disorder, unspecified: Secondary | ICD-10-CM | POA: Diagnosis not present

## 2020-03-30 DIAGNOSIS — M25511 Pain in right shoulder: Secondary | ICD-10-CM | POA: Diagnosis not present

## 2020-03-31 DIAGNOSIS — R635 Abnormal weight gain: Secondary | ICD-10-CM | POA: Diagnosis not present

## 2020-03-31 DIAGNOSIS — Z6841 Body Mass Index (BMI) 40.0 and over, adult: Secondary | ICD-10-CM | POA: Diagnosis not present

## 2020-03-31 DIAGNOSIS — D509 Iron deficiency anemia, unspecified: Secondary | ICD-10-CM | POA: Diagnosis not present

## 2020-04-01 DIAGNOSIS — T2691XD Corrosion of right eye and adnexa, part unspecified, subsequent encounter: Secondary | ICD-10-CM | POA: Diagnosis not present

## 2020-04-01 DIAGNOSIS — H18613 Keratoconus, stable, bilateral: Secondary | ICD-10-CM | POA: Diagnosis not present

## 2020-04-01 DIAGNOSIS — Z77098 Contact with and (suspected) exposure to other hazardous, chiefly nonmedicinal, chemicals: Secondary | ICD-10-CM | POA: Diagnosis not present

## 2020-04-04 DIAGNOSIS — M25511 Pain in right shoulder: Secondary | ICD-10-CM | POA: Diagnosis not present

## 2020-04-05 DIAGNOSIS — F331 Major depressive disorder, recurrent, moderate: Secondary | ICD-10-CM | POA: Diagnosis not present

## 2020-04-05 DIAGNOSIS — F4310 Post-traumatic stress disorder, unspecified: Secondary | ICD-10-CM | POA: Diagnosis not present

## 2020-04-05 DIAGNOSIS — G4700 Insomnia, unspecified: Secondary | ICD-10-CM | POA: Diagnosis not present

## 2020-04-05 DIAGNOSIS — F411 Generalized anxiety disorder: Secondary | ICD-10-CM | POA: Diagnosis not present

## 2020-04-06 DIAGNOSIS — F331 Major depressive disorder, recurrent, moderate: Secondary | ICD-10-CM | POA: Diagnosis not present

## 2020-04-06 DIAGNOSIS — F4310 Post-traumatic stress disorder, unspecified: Secondary | ICD-10-CM | POA: Diagnosis not present

## 2020-04-06 DIAGNOSIS — G4700 Insomnia, unspecified: Secondary | ICD-10-CM | POA: Diagnosis not present

## 2020-04-06 DIAGNOSIS — F411 Generalized anxiety disorder: Secondary | ICD-10-CM | POA: Diagnosis not present

## 2020-04-07 DIAGNOSIS — F909 Attention-deficit hyperactivity disorder, unspecified type: Secondary | ICD-10-CM | POA: Diagnosis not present

## 2020-04-07 DIAGNOSIS — Z6841 Body Mass Index (BMI) 40.0 and over, adult: Secondary | ICD-10-CM | POA: Diagnosis not present

## 2020-04-07 DIAGNOSIS — E038 Other specified hypothyroidism: Secondary | ICD-10-CM | POA: Diagnosis not present

## 2020-04-08 DIAGNOSIS — M25511 Pain in right shoulder: Secondary | ICD-10-CM | POA: Diagnosis not present

## 2020-04-09 DIAGNOSIS — Z8709 Personal history of other diseases of the respiratory system: Secondary | ICD-10-CM | POA: Diagnosis not present

## 2020-04-09 DIAGNOSIS — Z760 Encounter for issue of repeat prescription: Secondary | ICD-10-CM | POA: Diagnosis not present

## 2020-04-12 DIAGNOSIS — F1010 Alcohol abuse, uncomplicated: Secondary | ICD-10-CM | POA: Diagnosis not present

## 2020-04-12 DIAGNOSIS — F331 Major depressive disorder, recurrent, moderate: Secondary | ICD-10-CM | POA: Diagnosis not present

## 2020-04-12 DIAGNOSIS — F4310 Post-traumatic stress disorder, unspecified: Secondary | ICD-10-CM | POA: Diagnosis not present

## 2020-04-12 DIAGNOSIS — F411 Generalized anxiety disorder: Secondary | ICD-10-CM | POA: Diagnosis not present

## 2020-04-14 DIAGNOSIS — F909 Attention-deficit hyperactivity disorder, unspecified type: Secondary | ICD-10-CM | POA: Diagnosis not present

## 2020-04-14 DIAGNOSIS — Z6841 Body Mass Index (BMI) 40.0 and over, adult: Secondary | ICD-10-CM | POA: Diagnosis not present

## 2020-04-14 DIAGNOSIS — D509 Iron deficiency anemia, unspecified: Secondary | ICD-10-CM | POA: Diagnosis not present

## 2020-04-19 DIAGNOSIS — F331 Major depressive disorder, recurrent, moderate: Secondary | ICD-10-CM | POA: Diagnosis not present

## 2020-04-19 DIAGNOSIS — F900 Attention-deficit hyperactivity disorder, predominantly inattentive type: Secondary | ICD-10-CM | POA: Diagnosis not present

## 2020-04-19 DIAGNOSIS — F411 Generalized anxiety disorder: Secondary | ICD-10-CM | POA: Diagnosis not present

## 2020-04-26 DIAGNOSIS — F411 Generalized anxiety disorder: Secondary | ICD-10-CM | POA: Diagnosis not present

## 2020-04-26 DIAGNOSIS — F331 Major depressive disorder, recurrent, moderate: Secondary | ICD-10-CM | POA: Diagnosis not present

## 2020-04-27 DIAGNOSIS — M25511 Pain in right shoulder: Secondary | ICD-10-CM | POA: Diagnosis not present

## 2020-04-28 DIAGNOSIS — E038 Other specified hypothyroidism: Secondary | ICD-10-CM | POA: Diagnosis not present

## 2020-04-28 DIAGNOSIS — Z6841 Body Mass Index (BMI) 40.0 and over, adult: Secondary | ICD-10-CM | POA: Diagnosis not present

## 2020-04-29 DIAGNOSIS — M25511 Pain in right shoulder: Secondary | ICD-10-CM | POA: Diagnosis not present

## 2020-05-03 DIAGNOSIS — F4310 Post-traumatic stress disorder, unspecified: Secondary | ICD-10-CM | POA: Diagnosis not present

## 2020-05-03 DIAGNOSIS — F900 Attention-deficit hyperactivity disorder, predominantly inattentive type: Secondary | ICD-10-CM | POA: Diagnosis not present

## 2020-05-03 DIAGNOSIS — F411 Generalized anxiety disorder: Secondary | ICD-10-CM | POA: Diagnosis not present

## 2020-05-03 DIAGNOSIS — F331 Major depressive disorder, recurrent, moderate: Secondary | ICD-10-CM | POA: Diagnosis not present

## 2020-05-05 DIAGNOSIS — Z6841 Body Mass Index (BMI) 40.0 and over, adult: Secondary | ICD-10-CM | POA: Diagnosis not present

## 2020-05-05 DIAGNOSIS — M25511 Pain in right shoulder: Secondary | ICD-10-CM | POA: Diagnosis not present

## 2020-05-05 DIAGNOSIS — E038 Other specified hypothyroidism: Secondary | ICD-10-CM | POA: Diagnosis not present

## 2020-05-09 DIAGNOSIS — M25511 Pain in right shoulder: Secondary | ICD-10-CM | POA: Diagnosis not present

## 2020-05-10 DIAGNOSIS — G4700 Insomnia, unspecified: Secondary | ICD-10-CM | POA: Diagnosis not present

## 2020-05-10 DIAGNOSIS — F4310 Post-traumatic stress disorder, unspecified: Secondary | ICD-10-CM | POA: Diagnosis not present

## 2020-05-10 DIAGNOSIS — F331 Major depressive disorder, recurrent, moderate: Secondary | ICD-10-CM | POA: Diagnosis not present

## 2020-05-10 DIAGNOSIS — F411 Generalized anxiety disorder: Secondary | ICD-10-CM | POA: Diagnosis not present

## 2020-05-12 DIAGNOSIS — E559 Vitamin D deficiency, unspecified: Secondary | ICD-10-CM | POA: Diagnosis not present

## 2020-05-12 DIAGNOSIS — Z6841 Body Mass Index (BMI) 40.0 and over, adult: Secondary | ICD-10-CM | POA: Diagnosis not present

## 2020-05-13 DIAGNOSIS — Z6841 Body Mass Index (BMI) 40.0 and over, adult: Secondary | ICD-10-CM | POA: Diagnosis not present

## 2020-05-13 DIAGNOSIS — E6609 Other obesity due to excess calories: Secondary | ICD-10-CM | POA: Diagnosis not present

## 2020-05-13 DIAGNOSIS — M25511 Pain in right shoulder: Secondary | ICD-10-CM | POA: Diagnosis not present

## 2020-05-13 DIAGNOSIS — Z713 Dietary counseling and surveillance: Secondary | ICD-10-CM | POA: Diagnosis not present

## 2020-05-17 DIAGNOSIS — F331 Major depressive disorder, recurrent, moderate: Secondary | ICD-10-CM | POA: Diagnosis not present

## 2020-05-17 DIAGNOSIS — F4310 Post-traumatic stress disorder, unspecified: Secondary | ICD-10-CM | POA: Diagnosis not present

## 2020-05-17 DIAGNOSIS — F411 Generalized anxiety disorder: Secondary | ICD-10-CM | POA: Diagnosis not present

## 2020-05-17 DIAGNOSIS — G4700 Insomnia, unspecified: Secondary | ICD-10-CM | POA: Diagnosis not present

## 2020-05-18 DIAGNOSIS — M25511 Pain in right shoulder: Secondary | ICD-10-CM | POA: Diagnosis not present

## 2020-05-19 DIAGNOSIS — H18613 Keratoconus, stable, bilateral: Secondary | ICD-10-CM | POA: Diagnosis not present

## 2020-05-20 DIAGNOSIS — Z6841 Body Mass Index (BMI) 40.0 and over, adult: Secondary | ICD-10-CM | POA: Diagnosis not present

## 2020-05-20 DIAGNOSIS — M25511 Pain in right shoulder: Secondary | ICD-10-CM | POA: Diagnosis not present

## 2020-05-20 DIAGNOSIS — E559 Vitamin D deficiency, unspecified: Secondary | ICD-10-CM | POA: Diagnosis not present

## 2020-05-24 DIAGNOSIS — G4700 Insomnia, unspecified: Secondary | ICD-10-CM | POA: Diagnosis not present

## 2020-05-24 DIAGNOSIS — F411 Generalized anxiety disorder: Secondary | ICD-10-CM | POA: Diagnosis not present

## 2020-05-24 DIAGNOSIS — F4310 Post-traumatic stress disorder, unspecified: Secondary | ICD-10-CM | POA: Diagnosis not present

## 2020-05-24 DIAGNOSIS — F331 Major depressive disorder, recurrent, moderate: Secondary | ICD-10-CM | POA: Diagnosis not present

## 2020-05-25 ENCOUNTER — Ambulatory Visit (INDEPENDENT_AMBULATORY_CARE_PROVIDER_SITE_OTHER): Payer: BC Managed Care – PPO | Admitting: Primary Care

## 2020-05-25 ENCOUNTER — Other Ambulatory Visit: Payer: Self-pay

## 2020-05-25 ENCOUNTER — Encounter (INDEPENDENT_AMBULATORY_CARE_PROVIDER_SITE_OTHER): Payer: Self-pay | Admitting: Primary Care

## 2020-05-25 VITALS — BP 137/102 | HR 100 | Temp 97.5°F | Ht 62.0 in | Wt 337.0 lb

## 2020-05-25 DIAGNOSIS — F4310 Post-traumatic stress disorder, unspecified: Secondary | ICD-10-CM | POA: Diagnosis not present

## 2020-05-25 DIAGNOSIS — Z23 Encounter for immunization: Secondary | ICD-10-CM | POA: Diagnosis not present

## 2020-05-25 DIAGNOSIS — I10 Essential (primary) hypertension: Secondary | ICD-10-CM | POA: Diagnosis not present

## 2020-05-25 DIAGNOSIS — J4541 Moderate persistent asthma with (acute) exacerbation: Secondary | ICD-10-CM

## 2020-05-25 DIAGNOSIS — Z7689 Persons encountering health services in other specified circumstances: Secondary | ICD-10-CM | POA: Diagnosis not present

## 2020-05-25 MED ORDER — LABETALOL HCL 100 MG PO TABS: 100.0000 mg | ORAL_TABLET | Freq: Two times a day (BID) | ORAL | 1 refills | Status: DC

## 2020-05-25 MED ORDER — BREO ELLIPTA 100-25 MCG/INH IN AEPB: 1.0000 | INHALATION_SPRAY | Freq: Every day | RESPIRATORY_TRACT | 3 refills | Status: DC

## 2020-05-25 MED ORDER — ALBUTEROL SULFATE HFA 108 (90 BASE) MCG/ACT IN AERS: 2.0000 | INHALATION_SPRAY | Freq: Four times a day (QID) | RESPIRATORY_TRACT | 1 refills | Status: DC | PRN

## 2020-05-25 NOTE — Patient Instructions (Addendum)
Managing Your Hypertension Hypertension is commonly called high blood pressure. This is when the force of your blood pressing against the walls of your arteries is too strong. Arteries are blood vessels that carry blood from your heart throughout your body. Hypertension forces the heart to work harder to pump blood, and may cause the arteries to become narrow or stiff. Having untreated or uncontrolled hypertension can cause heart attack, stroke, kidney disease, and other problems. What are blood pressure readings? A blood pressure reading consists of a higher number over a lower number. Ideally, your blood pressure should be below 120/80. The first ("top") number is called the systolic pressure. It is a measure of the pressure in your arteries as your heart beats. The second ("bottom") number is called the diastolic pressure. It is a measure of the pressure in your arteries as the heart relaxes. What does my blood pressure reading mean? Blood pressure is classified into four stages. Based on your blood pressure reading, your health care provider may use the following stages to determine what type of treatment you need, if any. Systolic pressure and diastolic pressure are measured in a unit called mm Hg. Normal  Systolic pressure: below 120.  Diastolic pressure: below 80. Elevated  Systolic pressure: 120-129.  Diastolic pressure: below 80. Hypertension stage 1  Systolic pressure: 130-139.  Diastolic pressure: 80-89. Hypertension stage 2  Systolic pressure: 140 or above.  Diastolic pressure: 90 or above. What health risks are associated with hypertension? Managing your hypertension is an important responsibility. Uncontrolled hypertension can lead to:  A heart attack.  A stroke.  A weakened blood vessel (aneurysm).  Heart failure.  Kidney damage.  Eye damage.  Metabolic syndrome.  Memory and concentration problems. What changes can I make to manage my  hypertension? Hypertension can be managed by making lifestyle changes and possibly by taking medicines. Your health care provider will help you make a plan to bring your blood pressure within a normal range. Eating and drinking   Eat a diet that is high in fiber and potassium, and low in salt (sodium), added sugar, and fat. An example eating plan is called the DASH (Dietary Approaches to Stop Hypertension) diet. To eat this way: ? Eat plenty of fresh fruits and vegetables. Try to fill half of your plate at each meal with fruits and vegetables. ? Eat whole grains, such as whole wheat pasta, brown rice, or whole grain bread. Fill about one quarter of your plate with whole grains. ? Eat low-fat diary products. ? Avoid fatty cuts of meat, processed or cured meats, and poultry with skin. Fill about one quarter of your plate with lean proteins such as fish, chicken without skin, beans, eggs, and tofu. ? Avoid premade and processed foods. These tend to be higher in sodium, added sugar, and fat.  Reduce your daily sodium intake. Most people with hypertension should eat less than 1,500 mg of sodium a day.  Limit alcohol intake to no more than 1 drink a day for nonpregnant women and 2 drinks a day for men. One drink equals 12 oz of beer, 5 oz of wine, or 1 oz of hard liquor. Lifestyle  Work with your health care provider to maintain a healthy body weight, or to lose weight. Ask what an ideal weight is for you.  Get at least 30 minutes of exercise that causes your heart to beat faster (aerobic exercise) most days of the week. Activities may include walking, swimming, or biking.  Include exercise   to strengthen your muscles (resistance exercise), such as weight lifting, as part of your weekly exercise routine. Try to do these types of exercises for 30 minutes at least 3 days a week.  Do not use any products that contain nicotine or tobacco, such as cigarettes and e-cigarettes. If you need help quitting,  ask your health care provider.  Control any long-term (chronic) conditions you have, such as high cholesterol or diabetes. Monitoring  Monitor your blood pressure at home as told by your health care provider. Your personal target blood pressure may vary depending on your medical conditions, your age, and other factors.  Have your blood pressure checked regularly, as often as told by your health care provider. Working with your health care provider  Review all the medicines you take with your health care provider because there may be side effects or interactions.  Talk with your health care provider about your diet, exercise habits, and other lifestyle factors that may be contributing to hypertension.  Visit your health care provider regularly. Your health care provider can help you create and adjust your plan for managing hypertension. Will I need medicine to control my blood pressure? Your health care provider may prescribe medicine if lifestyle changes are not enough to get your blood pressure under control, and if:  Your systolic blood pressure is 130 or higher.  Your diastolic blood pressure is 80 or higher. Take medicines only as told by your health care provider. Follow the directions carefully. Blood pressure medicines must be taken as prescribed. The medicine does not work as well when you skip doses. Skipping doses also puts you at risk for problems. Contact a health care provider if:  You think you are having a reaction to medicines you have taken.  You have repeated (recurrent) headaches.  You feel dizzy.  You have swelling in your ankles.  You have trouble with your vision. Get help right away if:  You develop a severe headache or confusion.  You have unusual weakness or numbness, or you feel faint.  You have severe pain in your chest or abdomen.  You vomit repeatedly.  You have trouble breathing. Summary  Hypertension is when the force of blood pumping  through your arteries is too strong. If this condition is not controlled, it may put you at risk for serious complications.  Your personal target blood pressure may vary depending on your medical conditions, your age, and other factors. For most people, a normal blood pressure is less than 120/80.  Hypertension is managed by lifestyle changes, medicines, or both. Lifestyle changes include weight loss, eating a healthy, low-sodium diet, exercising more, and limiting alcohol. This information is not intended to replace advice given to you by your health care provider. Make sure you discuss any questions you have with your health care provider. Document Revised: 11/14/2018 Document Reviewed: 06/20/2016 Elsevier Patient Education  2020 [employer] Asthma Attack Prevention, Adult Although you may not be able to control the fact that you have asthma, you can take actions to prevent episodes of asthma (asthma attacks). These actions include:  Creating a written plan for managing and treating your asthma attacks (asthma action plan).  Monitoring your asthma.  Avoiding things that can irritate your airways or make your asthma symptoms worse (asthma triggers).  Taking your medicines as directed.  Acting quickly if you have signs or symptoms of an asthma attack. What are some ways to prevent an asthma attack? Create a plan Work with your health care  provider to create an asthma action plan. This plan should include:  A list of your asthma triggers and how to avoid them.  A list of symptoms that you experience during an asthma attack.  Information about when to take medicine and how much medicine to take.  Information to help you understand your peak flow measurements.  Contact information for your health care providers.  Daily actions that you can take to control asthma. Monitor your asthma To monitor your asthma:  Use your peak flow meter every morning and every evening for 2-3 weeks.  Record the results in a journal. A drop in your peak flow numbers on one or more days may mean that you are starting to have an asthma attack, even if you are not having symptoms.  When you have asthma symptoms, write them down in a journal.  Avoid asthma triggers Work with your health care provider to find out what your asthma triggers are. This can be done by:  Being tested for allergies.  Keeping a journal that notes when asthma attacks occur and what may have contributed to them.  Asking your health care provider whether other medical conditions make your asthma worse. Common asthma triggers include:  Dust.  Smoke. This includes campfire smoke and secondhand smoke from tobacco products.  Pet dander.  Trees, grasses or pollens.  Very cold, dry, or humid air.  Mold.  Foods that contain high amounts of sulfites.  Strong smells.  Engine exhaust and air pollution.  Aerosol sprays and fumes from household cleaners.  Household pests and their droppings, including dust mites and cockroaches.  Certain medicines, including NSAIDs. Once you have determined your asthma triggers, take steps to avoid them. Depending on your triggers, you may be able to reduce the chance of an asthma attack by:  Keeping your home clean. Have someone dust and vacuum your home for you 1 or 2 times a week. If possible, have them use a high-efficiency particulate arrestance (HEPA) vacuum.  Washing your sheets weekly in hot water.  Using allergy-proof mattress covers and casings on your bed.  Keeping pets out of your home.  Taking care of mold and water problems in your home.  Avoiding areas where people smoke.  Avoiding using strong perfumes or odor sprays.  Avoid spending a lot of time outdoors when pollen counts are high and on very windy days.  Talking with your health care provider before stopping or starting any new medicines. Medicines Take over-the-counter and prescription medicines  only as told by your health care provider. Many asthma attacks can be prevented by carefully following your medicine schedule. Taking your medicines correctly is especially important when you cannot avoid certain asthma triggers. Even if you are doing well, do not stop taking your medicine and do not take less medicine. Act quickly If an asthma attack happens, acting quickly can decrease how severe it is and how long it lasts. Take these actions:  Pay attention to your symptoms. If you are coughing, wheezing, or having difficulty breathing, do not wait to see if your symptoms go away on their own. Follow your asthma action plan.  If you have followed your asthma action plan and your symptoms are not improving, call your health care provider or seek immediate medical care at the nearest hospital. It is important to write down how often you need to use your fast-acting rescue inhaler. You can track how often you use an inhaler in your journal. If you are using your  rescue inhaler more often, it may mean that your asthma is not under control. Adjusting your asthma treatment plan may help you to prevent future asthma attacks and help you to gain better control of your condition. How can I prevent an asthma attack when I exercise? Exercise is a common asthma trigger. To prevent asthma attacks during exercise:  Follow advice from your health care provider about whether you should use your fast-acting inhaler before exercising. Many people with asthma experience exercise-induced bronchoconstriction (EIB). This condition often worsens during vigorous exercise in cold, humid, or dry environments. Usually, people with EIB can stay very active by using a fast-acting inhaler before exercising.  Avoid exercising outdoors in very cold or humid weather.  Avoid exercising outdoors when pollen counts are high.  Warm up and cool down when exercising.  Stop exercising right away if asthma symptoms start. Consider  taking part in exercises that are less likely to cause asthma symptoms such as:  Indoor swimming.  Biking.  Walking.  Hiking.  Playing football. This information is not intended to replace advice given to you by your health care provider. Make sure you discuss any questions you have with your health care provider. Document Revised: 07/05/2017 Document Reviewed: 01/07/2016 Elsevier Patient Education  2020 [employer].
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
## 2020-05-25 NOTE — Progress Notes (Signed)
New Patient Office Visit  Subjective:  Patient ID: Kelly Sheppard, female    DOB: 01/17/1986  Age: 34 y.o. MRN: [id number]  CC:  Chief Complaint  Patient presents with   New Patient (Initial Visit)    asthma   Shoulder Pain    right     HPI Kelly Sheppard is a 34 year old morbid obese female Body mass index is 61.64 kg/m. presents for establishment of care, asthma, and right shoulder pain.  She has recently lost 30 pounds since June 2021.  She has been monitoring what she eats and exercising daily.  She previously thought about bariatric surgery but is determined and on the right path for weight loss doing it herself.  Blood pressure is quite elevated today diastolic 137/102 she does admit to cigarette smoking.  She is also on a medication to increase her blood pressure prazosin 2 mg at bedtime   Past Medical History:  Diagnosis Date   Hypertension     No past surgical history on file.  No family history on file.  Social History   Socioeconomic History   Marital status: Single    Spouse name: Not on file   Number of children: Not on file   Years of education: Not on file   Highest education level: Not on file  Occupational History   Not on file  Tobacco Use   Smoking status: Current Every Day Smoker   Smokeless tobacco: Never Used  Substance and Sexual Activity   Alcohol use: Yes   Drug use: No   Sexual activity: Yes  Other Topics Concern   Not on file  Social History Narrative   Not on file   Social Determinants of Health   Financial Resource Strain:    Difficulty of Paying Living Expenses: Not on file  Food Insecurity:    Worried About Running Out of Food in the Last Year: Not on file   Ran Out of Food in the Last Year: Not on file  Transportation Needs:    Lack of Transportation (Medical): Not on file   Lack of Transportation (Non-Medical): Not on file  Physical Activity:    Days of Exercise per Week: Not on file   Minutes  of Exercise per Session: Not on file  Stress:    Feeling of Stress : Not on file  Social Connections:    Frequency of Communication with Friends and Family: Not on file   Frequency of Social Gatherings with Friends and Family: Not on file   Attends Religious Services: Not on file   Active Member of Clubs or Organizations: Not on file   Attends Banker Meetings: Not on file   Marital Status: Not on file  Intimate Partner Violence:    Fear of Current or Ex-Partner: Not on file   Emotionally Abused: Not on file   Physically Abused: Not on file   Sexually Abused: Not on file    ROS Review of Systems  Psychiatric/Behavioral:       ADD PTSD Followed by psychiatrist  All other systems reviewed and are negative.   Objective:   Today's Vitals: BP (!) 137/102 (BP Location: Right Arm, Patient Position: Sitting, Cuff Size: Large)    Pulse 100    Temp (!) 97.5 F (36.4 C) (Temporal)    Ht 5\' 2"  (1.575 m)    Wt (!) 337 lb (152.9 kg)    LMP 04/30/2020    SpO2 99%  BMI 61.64 kg/m   Physical Exam Vitals reviewed.  Constitutional:      Appearance: Normal appearance. She is obese.  HENT:     Head: Normocephalic.     Right Ear: Tympanic membrane normal.     Left Ear: Tympanic membrane normal.     Nose: Congestion present.  Eyes:     Extraocular Movements: Extraocular movements intact.     Pupils: Pupils are equal, round, and reactive to light.  Cardiovascular:     Rate and Rhythm: Normal rate and regular rhythm.  Pulmonary:     Effort: Pulmonary effort is normal.     Breath sounds: Normal breath sounds.  Abdominal:     General: Bowel sounds are normal. There is distension.     Palpations: Abdomen is soft.  Musculoskeletal:        General: Normal range of motion.     Cervical back: Normal range of motion and neck supple.  Skin:    General: Skin is warm and dry.  Neurological:     Mental Status: She is alert and oriented to person, place, and time.   Psychiatric:        Mood and Affect: Mood normal.        Behavior: Behavior normal.        Thought Content: Thought content normal.        Judgment: Judgment normal.     Assessment & Plan:   Kelly Sheppard was seen today for new patient (initial visit) and shoulder pain.  Diagnoses and all orders for this visit:  Essential hypertension Counseled on blood pressure goal of less than 130/80, low-sodium, DASH diet, medication compliance, 150 minutes of moderate intensity exercise per week. Started on labetalol 100 mg twice daily  Need for Tdap vaccination -     Tdap vaccine greater than or equal to 7yo IM  PTSD (post-traumatic stress disorder) Followed outside of practice  Encounter to establish care [name], NP-C will be your  (PCP) she is mastered prepared . Able to diagnosed and treatment also  answer health concern as well as continuing care of varied medical conditions, not limited by cause, organ system, or diagnosis.   Morbid obesity (HCC) She is actively involved and determine to lose weight naturally with diet and exercise she states she has successfully lost 30 pounds within the last 6 months.  Encouraged to memento  Moderate persistent asthma with acute exacerbation States frequent asthma exacerbation only has had short acting beta agonist will refill albuterol for rescue inhaler and add Breo.  Other orders -     labetalol (NORMODYNE) 100 MG tablet; Take 1 tablet (100 mg total) by mouth 2 (two) times daily. -     albuterol (VENTOLIN HFA) 108 (90 Base) MCG/ACT inhaler; Inhale 2 puffs into the lungs every 6 (six) hours as needed for wheezing or shortness of breath. -     fluticasone furoate-vilanterol (BREO ELLIPTA) 100-25 MCG/INH AEPB; Inhale 1 puff into the lungs daily.    Outpatient Encounter Medications as of 05/25/2020  Medication Sig   albuterol (VENTOLIN HFA) 108 (90 Base) MCG/ACT inhaler Inhale 2 puffs into the lungs every 6 (six) hours as needed for  wheezing or shortness of breath.   amphetamine-dextroamphetamine (ADDERALL XR) 20 MG 24 hr capsule Take 20 mg by mouth daily.   clonazePAM (KLONOPIN) 1 MG tablet Take 1 mg by mouth at bedtime.   cyclobenzaprine (FLEXERIL) 5 MG tablet Take 1-2 tablets 3 times daily as needed   prazosin (MINIPRESS)  2 MG capsule Take 2 mg by mouth at bedtime.   vortioxetine HBr (TRINTELLIX) 20 MG TABS tablet Take 20 mg by mouth daily.   [DISCONTINUED] albuterol (VENTOLIN HFA) 108 (90 Base) MCG/ACT inhaler Inhale into the lungs.   fluticasone furoate-vilanterol (BREO ELLIPTA) 100-25 MCG/INH AEPB Inhale 1 puff into the lungs daily.   labetalol (NORMODYNE) 100 MG tablet Take 1 tablet (100 mg total) by mouth 2 (two) times daily.   [DISCONTINUED] albuterol (ACCUNEB) 0.63 MG/3ML nebulizer solution Inhale into the lungs.   [DISCONTINUED] ibuprofen (ADVIL) 600 MG tablet Take 1 tablet (600 mg total) by mouth every 6 (six) hours as needed.   [DISCONTINUED] triamcinolone cream (KENALOG) 0.1 % Apply 1 application topically 2 (two) times daily.   No facility-administered encounter medications on file as of 05/25/2020.    Follow-up: Return in about 4 weeks (around 06/22/2020) for Bp check .   [name], NP

## 2020-05-26 DIAGNOSIS — F1010 Alcohol abuse, uncomplicated: Secondary | ICD-10-CM | POA: Diagnosis not present

## 2020-05-26 DIAGNOSIS — Z720 Tobacco use: Secondary | ICD-10-CM | POA: Diagnosis not present

## 2020-05-26 DIAGNOSIS — R635 Abnormal weight gain: Secondary | ICD-10-CM | POA: Diagnosis not present

## 2020-05-26 DIAGNOSIS — E559 Vitamin D deficiency, unspecified: Secondary | ICD-10-CM | POA: Diagnosis not present

## 2020-05-30 DIAGNOSIS — Z1329 Encounter for screening for other suspected endocrine disorder: Secondary | ICD-10-CM | POA: Diagnosis not present

## 2020-05-30 DIAGNOSIS — Z0183 Encounter for blood typing: Secondary | ICD-10-CM | POA: Diagnosis not present

## 2020-05-30 DIAGNOSIS — Z789 Other specified health status: Secondary | ICD-10-CM | POA: Diagnosis not present

## 2020-05-30 DIAGNOSIS — Z3141 Encounter for fertility testing: Secondary | ICD-10-CM | POA: Diagnosis not present

## 2020-05-30 DIAGNOSIS — Z130 Encounter for screening for diseases of the blood and blood-forming organs and certain disorders involving the immune mechanism: Secondary | ICD-10-CM | POA: Diagnosis not present

## 2020-05-31 DIAGNOSIS — F331 Major depressive disorder, recurrent, moderate: Secondary | ICD-10-CM | POA: Diagnosis not present

## 2020-05-31 DIAGNOSIS — F411 Generalized anxiety disorder: Secondary | ICD-10-CM | POA: Diagnosis not present

## 2020-05-31 DIAGNOSIS — F4310 Post-traumatic stress disorder, unspecified: Secondary | ICD-10-CM | POA: Diagnosis not present

## 2020-05-31 DIAGNOSIS — G4700 Insomnia, unspecified: Secondary | ICD-10-CM | POA: Diagnosis not present

## 2020-06-01 DIAGNOSIS — Z8742 Personal history of other diseases of the female genital tract: Secondary | ICD-10-CM | POA: Diagnosis not present

## 2020-06-01 DIAGNOSIS — E038 Other specified hypothyroidism: Secondary | ICD-10-CM | POA: Diagnosis not present

## 2020-06-01 DIAGNOSIS — M25511 Pain in right shoulder: Secondary | ICD-10-CM | POA: Diagnosis not present

## 2020-06-01 DIAGNOSIS — Z6841 Body Mass Index (BMI) 40.0 and over, adult: Secondary | ICD-10-CM | POA: Diagnosis not present

## 2020-06-02 DIAGNOSIS — M25511 Pain in right shoulder: Secondary | ICD-10-CM | POA: Diagnosis not present

## 2020-06-07 DIAGNOSIS — F331 Major depressive disorder, recurrent, moderate: Secondary | ICD-10-CM | POA: Diagnosis not present

## 2020-06-07 DIAGNOSIS — F4310 Post-traumatic stress disorder, unspecified: Secondary | ICD-10-CM | POA: Diagnosis not present

## 2020-06-07 DIAGNOSIS — G4700 Insomnia, unspecified: Secondary | ICD-10-CM | POA: Diagnosis not present

## 2020-06-07 DIAGNOSIS — F411 Generalized anxiety disorder: Secondary | ICD-10-CM | POA: Diagnosis not present

## 2020-06-09 DIAGNOSIS — Z6841 Body Mass Index (BMI) 40.0 and over, adult: Secondary | ICD-10-CM | POA: Diagnosis not present

## 2020-06-09 DIAGNOSIS — E559 Vitamin D deficiency, unspecified: Secondary | ICD-10-CM | POA: Diagnosis not present

## 2020-06-09 DIAGNOSIS — Z713 Dietary counseling and surveillance: Secondary | ICD-10-CM | POA: Diagnosis not present

## 2020-06-10 ENCOUNTER — Ambulatory Visit (INDEPENDENT_AMBULATORY_CARE_PROVIDER_SITE_OTHER): Payer: Self-pay | Admitting: *Deleted
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
## 2020-06-13 NOTE — Telephone Encounter (Signed)
FYI

## 2020-06-14 DIAGNOSIS — F4310 Post-traumatic stress disorder, unspecified: Secondary | ICD-10-CM | POA: Diagnosis not present

## 2020-06-14 DIAGNOSIS — F331 Major depressive disorder, recurrent, moderate: Secondary | ICD-10-CM | POA: Diagnosis not present

## 2020-06-14 DIAGNOSIS — F411 Generalized anxiety disorder: Secondary | ICD-10-CM | POA: Diagnosis not present

## 2020-06-14 DIAGNOSIS — G4700 Insomnia, unspecified: Secondary | ICD-10-CM | POA: Diagnosis not present

## 2020-06-17 DIAGNOSIS — Z6841 Body Mass Index (BMI) 40.0 and over, adult: Secondary | ICD-10-CM | POA: Diagnosis not present

## 2020-06-17 DIAGNOSIS — Z713 Dietary counseling and surveillance: Secondary | ICD-10-CM | POA: Diagnosis not present

## 2020-06-17 DIAGNOSIS — E669 Obesity, unspecified: Secondary | ICD-10-CM | POA: Diagnosis not present

## 2020-06-21 DIAGNOSIS — G4700 Insomnia, unspecified: Secondary | ICD-10-CM | POA: Diagnosis not present

## 2020-06-21 DIAGNOSIS — F331 Major depressive disorder, recurrent, moderate: Secondary | ICD-10-CM | POA: Diagnosis not present

## 2020-06-21 DIAGNOSIS — F4310 Post-traumatic stress disorder, unspecified: Secondary | ICD-10-CM | POA: Diagnosis not present

## 2020-06-21 DIAGNOSIS — F411 Generalized anxiety disorder: Secondary | ICD-10-CM | POA: Diagnosis not present

## 2020-06-22 ENCOUNTER — Ambulatory Visit (INDEPENDENT_AMBULATORY_CARE_PROVIDER_SITE_OTHER): Payer: BC Managed Care – PPO | Admitting: Primary Care

## 2020-06-28 DIAGNOSIS — F411 Generalized anxiety disorder: Secondary | ICD-10-CM | POA: Diagnosis not present

## 2020-06-28 DIAGNOSIS — G4700 Insomnia, unspecified: Secondary | ICD-10-CM | POA: Diagnosis not present

## 2020-06-28 DIAGNOSIS — F4310 Post-traumatic stress disorder, unspecified: Secondary | ICD-10-CM | POA: Diagnosis not present

## 2020-06-28 DIAGNOSIS — F331 Major depressive disorder, recurrent, moderate: Secondary | ICD-10-CM | POA: Diagnosis not present

## 2020-06-29 DIAGNOSIS — F411 Generalized anxiety disorder: Secondary | ICD-10-CM | POA: Diagnosis not present

## 2020-06-29 DIAGNOSIS — G4700 Insomnia, unspecified: Secondary | ICD-10-CM | POA: Diagnosis not present

## 2020-06-29 DIAGNOSIS — F4310 Post-traumatic stress disorder, unspecified: Secondary | ICD-10-CM | POA: Diagnosis not present

## 2020-06-29 DIAGNOSIS — F331 Major depressive disorder, recurrent, moderate: Secondary | ICD-10-CM | POA: Diagnosis not present

## 2020-07-04 ENCOUNTER — Ambulatory Visit (INDEPENDENT_AMBULATORY_CARE_PROVIDER_SITE_OTHER): Payer: BC Managed Care – PPO | Admitting: Primary Care

## 2020-07-05 DIAGNOSIS — F411 Generalized anxiety disorder: Secondary | ICD-10-CM | POA: Diagnosis not present

## 2020-07-05 DIAGNOSIS — F331 Major depressive disorder, recurrent, moderate: Secondary | ICD-10-CM | POA: Diagnosis not present

## 2020-07-05 DIAGNOSIS — G4700 Insomnia, unspecified: Secondary | ICD-10-CM | POA: Diagnosis not present

## 2020-07-05 DIAGNOSIS — F4310 Post-traumatic stress disorder, unspecified: Secondary | ICD-10-CM | POA: Diagnosis not present

## 2020-07-12 DIAGNOSIS — R87610 Atypical squamous cells of undetermined significance on cytologic smear of cervix (ASC-US): Secondary | ICD-10-CM | POA: Diagnosis not present

## 2020-07-12 DIAGNOSIS — Z01419 Encounter for gynecological examination (general) (routine) without abnormal findings: Secondary | ICD-10-CM | POA: Diagnosis not present

## 2020-07-12 DIAGNOSIS — F4310 Post-traumatic stress disorder, unspecified: Secondary | ICD-10-CM | POA: Diagnosis not present

## 2020-07-12 DIAGNOSIS — R87616 Satisfactory cervical smear but lacking transformation zone: Secondary | ICD-10-CM | POA: Diagnosis not present

## 2020-07-12 DIAGNOSIS — Z01411 Encounter for gynecological examination (general) (routine) with abnormal findings: Secondary | ICD-10-CM | POA: Diagnosis not present

## 2020-07-12 DIAGNOSIS — Z113 Encounter for screening for infections with a predominantly sexual mode of transmission: Secondary | ICD-10-CM | POA: Diagnosis not present

## 2020-07-12 DIAGNOSIS — F411 Generalized anxiety disorder: Secondary | ICD-10-CM | POA: Diagnosis not present

## 2020-07-12 DIAGNOSIS — Z124 Encounter for screening for malignant neoplasm of cervix: Secondary | ICD-10-CM | POA: Diagnosis not present

## 2020-07-12 DIAGNOSIS — Z1151 Encounter for screening for human papillomavirus (HPV): Secondary | ICD-10-CM | POA: Diagnosis not present

## 2020-07-12 DIAGNOSIS — N898 Other specified noninflammatory disorders of vagina: Secondary | ICD-10-CM | POA: Diagnosis not present

## 2020-07-12 DIAGNOSIS — G4700 Insomnia, unspecified: Secondary | ICD-10-CM | POA: Diagnosis not present

## 2020-07-12 DIAGNOSIS — F331 Major depressive disorder, recurrent, moderate: Secondary | ICD-10-CM | POA: Diagnosis not present

## 2020-07-15 DIAGNOSIS — Z3141 Encounter for fertility testing: Secondary | ICD-10-CM | POA: Diagnosis not present

## 2020-07-19 DIAGNOSIS — F331 Major depressive disorder, recurrent, moderate: Secondary | ICD-10-CM | POA: Diagnosis not present

## 2020-07-19 DIAGNOSIS — F4310 Post-traumatic stress disorder, unspecified: Secondary | ICD-10-CM | POA: Diagnosis not present

## 2020-07-19 DIAGNOSIS — Z03818 Encounter for observation for suspected exposure to other biological agents ruled out: Secondary | ICD-10-CM | POA: Diagnosis not present

## 2020-07-19 DIAGNOSIS — G4700 Insomnia, unspecified: Secondary | ICD-10-CM | POA: Diagnosis not present

## 2020-07-19 DIAGNOSIS — F411 Generalized anxiety disorder: Secondary | ICD-10-CM | POA: Diagnosis not present

## 2020-07-26 DIAGNOSIS — G4700 Insomnia, unspecified: Secondary | ICD-10-CM | POA: Diagnosis not present

## 2020-07-26 DIAGNOSIS — F331 Major depressive disorder, recurrent, moderate: Secondary | ICD-10-CM | POA: Diagnosis not present

## 2020-07-26 DIAGNOSIS — F4310 Post-traumatic stress disorder, unspecified: Secondary | ICD-10-CM | POA: Diagnosis not present

## 2020-07-26 DIAGNOSIS — F411 Generalized anxiety disorder: Secondary | ICD-10-CM | POA: Diagnosis not present

## 2020-08-02 DIAGNOSIS — Z6841 Body Mass Index (BMI) 40.0 and over, adult: Secondary | ICD-10-CM | POA: Diagnosis not present

## 2020-08-02 DIAGNOSIS — F411 Generalized anxiety disorder: Secondary | ICD-10-CM | POA: Diagnosis not present

## 2020-08-02 DIAGNOSIS — M25511 Pain in right shoulder: Secondary | ICD-10-CM | POA: Diagnosis not present

## 2020-08-02 DIAGNOSIS — F4310 Post-traumatic stress disorder, unspecified: Secondary | ICD-10-CM | POA: Diagnosis not present

## 2020-08-02 DIAGNOSIS — F331 Major depressive disorder, recurrent, moderate: Secondary | ICD-10-CM | POA: Diagnosis not present

## 2020-08-02 DIAGNOSIS — G4700 Insomnia, unspecified: Secondary | ICD-10-CM | POA: Diagnosis not present

## 2020-08-04 DIAGNOSIS — J0100 Acute maxillary sinusitis, unspecified: Secondary | ICD-10-CM | POA: Diagnosis not present

## 2020-08-04 DIAGNOSIS — J45901 Unspecified asthma with (acute) exacerbation: Secondary | ICD-10-CM | POA: Diagnosis not present

## 2020-08-15 ENCOUNTER — Telehealth (INDEPENDENT_AMBULATORY_CARE_PROVIDER_SITE_OTHER): Payer: BC Managed Care – PPO | Admitting: Primary Care

## 2020-08-15 ENCOUNTER — Ambulatory Visit (INDEPENDENT_AMBULATORY_CARE_PROVIDER_SITE_OTHER): Payer: BC Managed Care – PPO | Admitting: Primary Care

## 2020-09-13 DIAGNOSIS — G4700 Insomnia, unspecified: Secondary | ICD-10-CM | POA: Diagnosis not present

## 2020-09-13 DIAGNOSIS — F331 Major depressive disorder, recurrent, moderate: Secondary | ICD-10-CM | POA: Diagnosis not present

## 2020-09-13 DIAGNOSIS — F4310 Post-traumatic stress disorder, unspecified: Secondary | ICD-10-CM | POA: Diagnosis not present

## 2020-09-13 DIAGNOSIS — F411 Generalized anxiety disorder: Secondary | ICD-10-CM | POA: Diagnosis not present

## 2020-09-20 DIAGNOSIS — F411 Generalized anxiety disorder: Secondary | ICD-10-CM | POA: Diagnosis not present

## 2020-09-20 DIAGNOSIS — F331 Major depressive disorder, recurrent, moderate: Secondary | ICD-10-CM | POA: Diagnosis not present

## 2020-09-20 DIAGNOSIS — F4310 Post-traumatic stress disorder, unspecified: Secondary | ICD-10-CM | POA: Diagnosis not present

## 2020-09-20 DIAGNOSIS — F1010 Alcohol abuse, uncomplicated: Secondary | ICD-10-CM | POA: Diagnosis not present

## 2020-09-27 DIAGNOSIS — F4310 Post-traumatic stress disorder, unspecified: Secondary | ICD-10-CM | POA: Diagnosis not present

## 2020-09-27 DIAGNOSIS — F411 Generalized anxiety disorder: Secondary | ICD-10-CM | POA: Diagnosis not present

## 2020-09-27 DIAGNOSIS — F1010 Alcohol abuse, uncomplicated: Secondary | ICD-10-CM | POA: Diagnosis not present

## 2020-09-27 DIAGNOSIS — F331 Major depressive disorder, recurrent, moderate: Secondary | ICD-10-CM | POA: Diagnosis not present

## 2020-09-29 DIAGNOSIS — M25511 Pain in right shoulder: Secondary | ICD-10-CM | POA: Diagnosis not present

## 2020-10-03 DIAGNOSIS — G4700 Insomnia, unspecified: Secondary | ICD-10-CM | POA: Diagnosis not present

## 2020-10-03 DIAGNOSIS — F331 Major depressive disorder, recurrent, moderate: Secondary | ICD-10-CM | POA: Diagnosis not present

## 2020-10-03 DIAGNOSIS — F4310 Post-traumatic stress disorder, unspecified: Secondary | ICD-10-CM | POA: Diagnosis not present

## 2020-10-03 DIAGNOSIS — F411 Generalized anxiety disorder: Secondary | ICD-10-CM | POA: Diagnosis not present

## 2020-10-04 DIAGNOSIS — F411 Generalized anxiety disorder: Secondary | ICD-10-CM | POA: Diagnosis not present

## 2020-10-04 DIAGNOSIS — F1010 Alcohol abuse, uncomplicated: Secondary | ICD-10-CM | POA: Diagnosis not present

## 2020-10-04 DIAGNOSIS — F4310 Post-traumatic stress disorder, unspecified: Secondary | ICD-10-CM | POA: Diagnosis not present

## 2020-10-04 DIAGNOSIS — F331 Major depressive disorder, recurrent, moderate: Secondary | ICD-10-CM | POA: Diagnosis not present

## 2020-10-11 DIAGNOSIS — F4310 Post-traumatic stress disorder, unspecified: Secondary | ICD-10-CM | POA: Diagnosis not present

## 2020-10-11 DIAGNOSIS — F411 Generalized anxiety disorder: Secondary | ICD-10-CM | POA: Diagnosis not present

## 2020-10-11 DIAGNOSIS — F900 Attention-deficit hyperactivity disorder, predominantly inattentive type: Secondary | ICD-10-CM | POA: Diagnosis not present

## 2020-10-11 DIAGNOSIS — F331 Major depressive disorder, recurrent, moderate: Secondary | ICD-10-CM | POA: Diagnosis not present

## 2020-10-18 DIAGNOSIS — F331 Major depressive disorder, recurrent, moderate: Secondary | ICD-10-CM | POA: Diagnosis not present

## 2020-10-18 DIAGNOSIS — F1010 Alcohol abuse, uncomplicated: Secondary | ICD-10-CM | POA: Diagnosis not present

## 2020-10-18 DIAGNOSIS — F4310 Post-traumatic stress disorder, unspecified: Secondary | ICD-10-CM | POA: Diagnosis not present

## 2020-10-18 DIAGNOSIS — F411 Generalized anxiety disorder: Secondary | ICD-10-CM | POA: Diagnosis not present

## 2020-10-25 DIAGNOSIS — G4700 Insomnia, unspecified: Secondary | ICD-10-CM | POA: Diagnosis not present

## 2020-10-25 DIAGNOSIS — F331 Major depressive disorder, recurrent, moderate: Secondary | ICD-10-CM | POA: Diagnosis not present

## 2020-10-25 DIAGNOSIS — F4310 Post-traumatic stress disorder, unspecified: Secondary | ICD-10-CM | POA: Diagnosis not present

## 2020-10-25 DIAGNOSIS — F411 Generalized anxiety disorder: Secondary | ICD-10-CM | POA: Diagnosis not present

## 2020-10-27 ENCOUNTER — Ambulatory Visit (INDEPENDENT_AMBULATORY_CARE_PROVIDER_SITE_OTHER): Payer: BC Managed Care – PPO | Admitting: Primary Care

## 2020-11-01 DIAGNOSIS — G4700 Insomnia, unspecified: Secondary | ICD-10-CM | POA: Diagnosis not present

## 2020-11-01 DIAGNOSIS — F331 Major depressive disorder, recurrent, moderate: Secondary | ICD-10-CM | POA: Diagnosis not present

## 2020-11-01 DIAGNOSIS — F411 Generalized anxiety disorder: Secondary | ICD-10-CM | POA: Diagnosis not present

## 2020-11-01 DIAGNOSIS — F4310 Post-traumatic stress disorder, unspecified: Secondary | ICD-10-CM | POA: Diagnosis not present

## 2020-11-08 DIAGNOSIS — F1010 Alcohol abuse, uncomplicated: Secondary | ICD-10-CM | POA: Diagnosis not present

## 2020-11-08 DIAGNOSIS — F4310 Post-traumatic stress disorder, unspecified: Secondary | ICD-10-CM | POA: Diagnosis not present

## 2020-11-08 DIAGNOSIS — F331 Major depressive disorder, recurrent, moderate: Secondary | ICD-10-CM | POA: Diagnosis not present

## 2020-11-08 DIAGNOSIS — F411 Generalized anxiety disorder: Secondary | ICD-10-CM | POA: Diagnosis not present

## 2020-11-15 DIAGNOSIS — H18621 Keratoconus, unstable, right eye: Secondary | ICD-10-CM | POA: Diagnosis not present

## 2020-11-29 ENCOUNTER — Ambulatory Visit (INDEPENDENT_AMBULATORY_CARE_PROVIDER_SITE_OTHER): Payer: BC Managed Care – PPO | Admitting: Primary Care

## 2020-12-06 DIAGNOSIS — F4310 Post-traumatic stress disorder, unspecified: Secondary | ICD-10-CM | POA: Diagnosis not present

## 2020-12-06 DIAGNOSIS — R87610 Atypical squamous cells of undetermined significance on cytologic smear of cervix (ASC-US): Secondary | ICD-10-CM | POA: Diagnosis not present

## 2020-12-06 DIAGNOSIS — G4700 Insomnia, unspecified: Secondary | ICD-10-CM | POA: Diagnosis not present

## 2020-12-06 DIAGNOSIS — F411 Generalized anxiety disorder: Secondary | ICD-10-CM | POA: Diagnosis not present

## 2020-12-06 DIAGNOSIS — F331 Major depressive disorder, recurrent, moderate: Secondary | ICD-10-CM | POA: Diagnosis not present

## 2020-12-06 DIAGNOSIS — R87810 Cervical high risk human papillomavirus (HPV) DNA test positive: Secondary | ICD-10-CM | POA: Diagnosis not present

## 2020-12-13 DIAGNOSIS — F331 Major depressive disorder, recurrent, moderate: Secondary | ICD-10-CM | POA: Diagnosis not present

## 2020-12-13 DIAGNOSIS — G4700 Insomnia, unspecified: Secondary | ICD-10-CM | POA: Diagnosis not present

## 2020-12-13 DIAGNOSIS — F4310 Post-traumatic stress disorder, unspecified: Secondary | ICD-10-CM | POA: Diagnosis not present

## 2020-12-13 DIAGNOSIS — F411 Generalized anxiety disorder: Secondary | ICD-10-CM | POA: Diagnosis not present

## 2020-12-20 DIAGNOSIS — F331 Major depressive disorder, recurrent, moderate: Secondary | ICD-10-CM | POA: Diagnosis not present

## 2020-12-20 DIAGNOSIS — F4310 Post-traumatic stress disorder, unspecified: Secondary | ICD-10-CM | POA: Diagnosis not present

## 2020-12-20 DIAGNOSIS — F900 Attention-deficit hyperactivity disorder, predominantly inattentive type: Secondary | ICD-10-CM | POA: Diagnosis not present

## 2020-12-20 DIAGNOSIS — F411 Generalized anxiety disorder: Secondary | ICD-10-CM | POA: Diagnosis not present

## 2020-12-27 DIAGNOSIS — G4700 Insomnia, unspecified: Secondary | ICD-10-CM | POA: Diagnosis not present

## 2020-12-27 DIAGNOSIS — F4310 Post-traumatic stress disorder, unspecified: Secondary | ICD-10-CM | POA: Diagnosis not present

## 2020-12-27 DIAGNOSIS — F331 Major depressive disorder, recurrent, moderate: Secondary | ICD-10-CM | POA: Diagnosis not present

## 2020-12-27 DIAGNOSIS — F411 Generalized anxiety disorder: Secondary | ICD-10-CM | POA: Diagnosis not present

## 2021-01-03 DIAGNOSIS — G4700 Insomnia, unspecified: Secondary | ICD-10-CM | POA: Diagnosis not present

## 2021-01-03 DIAGNOSIS — F411 Generalized anxiety disorder: Secondary | ICD-10-CM | POA: Diagnosis not present

## 2021-01-03 DIAGNOSIS — F331 Major depressive disorder, recurrent, moderate: Secondary | ICD-10-CM | POA: Diagnosis not present

## 2021-01-03 DIAGNOSIS — F4310 Post-traumatic stress disorder, unspecified: Secondary | ICD-10-CM | POA: Diagnosis not present

## 2021-01-19 DIAGNOSIS — G4700 Insomnia, unspecified: Secondary | ICD-10-CM | POA: Diagnosis not present

## 2021-01-19 DIAGNOSIS — F4310 Post-traumatic stress disorder, unspecified: Secondary | ICD-10-CM | POA: Diagnosis not present

## 2021-01-19 DIAGNOSIS — F411 Generalized anxiety disorder: Secondary | ICD-10-CM | POA: Diagnosis not present

## 2021-01-19 DIAGNOSIS — F331 Major depressive disorder, recurrent, moderate: Secondary | ICD-10-CM | POA: Diagnosis not present

## 2021-01-24 DIAGNOSIS — G4700 Insomnia, unspecified: Secondary | ICD-10-CM | POA: Diagnosis not present

## 2021-01-24 DIAGNOSIS — F411 Generalized anxiety disorder: Secondary | ICD-10-CM | POA: Diagnosis not present

## 2021-01-24 DIAGNOSIS — F4310 Post-traumatic stress disorder, unspecified: Secondary | ICD-10-CM | POA: Diagnosis not present

## 2021-01-24 DIAGNOSIS — F331 Major depressive disorder, recurrent, moderate: Secondary | ICD-10-CM | POA: Diagnosis not present

## 2021-01-31 DIAGNOSIS — F331 Major depressive disorder, recurrent, moderate: Secondary | ICD-10-CM | POA: Diagnosis not present

## 2021-01-31 DIAGNOSIS — G4700 Insomnia, unspecified: Secondary | ICD-10-CM | POA: Diagnosis not present

## 2021-01-31 DIAGNOSIS — F4310 Post-traumatic stress disorder, unspecified: Secondary | ICD-10-CM | POA: Diagnosis not present

## 2021-01-31 DIAGNOSIS — F411 Generalized anxiety disorder: Secondary | ICD-10-CM | POA: Diagnosis not present

## 2021-02-07 DIAGNOSIS — F411 Generalized anxiety disorder: Secondary | ICD-10-CM | POA: Diagnosis not present

## 2021-02-07 DIAGNOSIS — F331 Major depressive disorder, recurrent, moderate: Secondary | ICD-10-CM | POA: Diagnosis not present

## 2021-02-07 DIAGNOSIS — F4310 Post-traumatic stress disorder, unspecified: Secondary | ICD-10-CM | POA: Diagnosis not present

## 2021-02-07 DIAGNOSIS — G4700 Insomnia, unspecified: Secondary | ICD-10-CM | POA: Diagnosis not present

## 2021-02-15 DIAGNOSIS — G4700 Insomnia, unspecified: Secondary | ICD-10-CM | POA: Diagnosis not present

## 2021-02-15 DIAGNOSIS — F331 Major depressive disorder, recurrent, moderate: Secondary | ICD-10-CM | POA: Diagnosis not present

## 2021-02-15 DIAGNOSIS — F411 Generalized anxiety disorder: Secondary | ICD-10-CM | POA: Diagnosis not present

## 2021-02-15 DIAGNOSIS — F4310 Post-traumatic stress disorder, unspecified: Secondary | ICD-10-CM | POA: Diagnosis not present

## 2021-02-17 DIAGNOSIS — N979 Female infertility, unspecified: Secondary | ICD-10-CM | POA: Diagnosis not present

## 2021-02-22 DIAGNOSIS — F411 Generalized anxiety disorder: Secondary | ICD-10-CM | POA: Diagnosis not present

## 2021-02-22 DIAGNOSIS — F331 Major depressive disorder, recurrent, moderate: Secondary | ICD-10-CM | POA: Diagnosis not present

## 2021-02-22 DIAGNOSIS — F4310 Post-traumatic stress disorder, unspecified: Secondary | ICD-10-CM | POA: Diagnosis not present

## 2021-02-22 DIAGNOSIS — G4700 Insomnia, unspecified: Secondary | ICD-10-CM | POA: Diagnosis not present

## 2021-03-01 DIAGNOSIS — F331 Major depressive disorder, recurrent, moderate: Secondary | ICD-10-CM | POA: Diagnosis not present

## 2021-03-01 DIAGNOSIS — F411 Generalized anxiety disorder: Secondary | ICD-10-CM | POA: Diagnosis not present

## 2021-03-01 DIAGNOSIS — F900 Attention-deficit hyperactivity disorder, predominantly inattentive type: Secondary | ICD-10-CM | POA: Diagnosis not present

## 2021-03-01 DIAGNOSIS — F4310 Post-traumatic stress disorder, unspecified: Secondary | ICD-10-CM | POA: Diagnosis not present

## 2021-03-02 DIAGNOSIS — F4310 Post-traumatic stress disorder, unspecified: Secondary | ICD-10-CM | POA: Diagnosis not present

## 2021-03-02 DIAGNOSIS — F411 Generalized anxiety disorder: Secondary | ICD-10-CM | POA: Diagnosis not present

## 2021-03-02 DIAGNOSIS — F331 Major depressive disorder, recurrent, moderate: Secondary | ICD-10-CM | POA: Diagnosis not present

## 2021-03-02 DIAGNOSIS — G4700 Insomnia, unspecified: Secondary | ICD-10-CM | POA: Diagnosis not present

## 2021-03-02 DIAGNOSIS — H18621 Keratoconus, unstable, right eye: Secondary | ICD-10-CM | POA: Diagnosis not present

## 2021-03-07 DIAGNOSIS — F411 Generalized anxiety disorder: Secondary | ICD-10-CM | POA: Diagnosis not present

## 2021-03-07 DIAGNOSIS — F4310 Post-traumatic stress disorder, unspecified: Secondary | ICD-10-CM | POA: Diagnosis not present

## 2021-03-07 DIAGNOSIS — F331 Major depressive disorder, recurrent, moderate: Secondary | ICD-10-CM | POA: Diagnosis not present

## 2021-03-07 DIAGNOSIS — G4700 Insomnia, unspecified: Secondary | ICD-10-CM | POA: Diagnosis not present

## 2021-03-14 DIAGNOSIS — F331 Major depressive disorder, recurrent, moderate: Secondary | ICD-10-CM | POA: Diagnosis not present

## 2021-03-14 DIAGNOSIS — G4700 Insomnia, unspecified: Secondary | ICD-10-CM | POA: Diagnosis not present

## 2021-03-14 DIAGNOSIS — F411 Generalized anxiety disorder: Secondary | ICD-10-CM | POA: Diagnosis not present

## 2021-03-14 DIAGNOSIS — F4310 Post-traumatic stress disorder, unspecified: Secondary | ICD-10-CM | POA: Diagnosis not present

## 2021-03-18 ENCOUNTER — Other Ambulatory Visit (INDEPENDENT_AMBULATORY_CARE_PROVIDER_SITE_OTHER): Payer: Self-pay | Admitting: Primary Care

## 2021-03-18 DIAGNOSIS — J4541 Moderate persistent asthma with (acute) exacerbation: Secondary | ICD-10-CM

## 2021-03-18 NOTE — Telephone Encounter (Signed)
Requested medications are due for refill today a little early, filled 7/22  Requested medications are on the active medication list yes  Last refill 7/22  Last visit 05/2020  Future visit scheduled no  Notes to clinic Pt asking for inhaler early, please assess.

## 2021-03-21 DIAGNOSIS — F4310 Post-traumatic stress disorder, unspecified: Secondary | ICD-10-CM | POA: Diagnosis not present

## 2021-03-21 DIAGNOSIS — F411 Generalized anxiety disorder: Secondary | ICD-10-CM | POA: Diagnosis not present

## 2021-03-21 DIAGNOSIS — G4700 Insomnia, unspecified: Secondary | ICD-10-CM | POA: Diagnosis not present

## 2021-03-21 DIAGNOSIS — F331 Major depressive disorder, recurrent, moderate: Secondary | ICD-10-CM | POA: Diagnosis not present

## 2021-03-28 DIAGNOSIS — F4310 Post-traumatic stress disorder, unspecified: Secondary | ICD-10-CM | POA: Diagnosis not present

## 2021-03-28 DIAGNOSIS — G4700 Insomnia, unspecified: Secondary | ICD-10-CM | POA: Diagnosis not present

## 2021-03-28 DIAGNOSIS — F411 Generalized anxiety disorder: Secondary | ICD-10-CM | POA: Diagnosis not present

## 2021-03-28 DIAGNOSIS — F331 Major depressive disorder, recurrent, moderate: Secondary | ICD-10-CM | POA: Diagnosis not present

## 2021-04-04 DIAGNOSIS — F331 Major depressive disorder, recurrent, moderate: Secondary | ICD-10-CM | POA: Diagnosis not present

## 2021-04-04 DIAGNOSIS — F4310 Post-traumatic stress disorder, unspecified: Secondary | ICD-10-CM | POA: Diagnosis not present

## 2021-04-04 DIAGNOSIS — F411 Generalized anxiety disorder: Secondary | ICD-10-CM | POA: Diagnosis not present

## 2021-04-04 DIAGNOSIS — G4700 Insomnia, unspecified: Secondary | ICD-10-CM | POA: Diagnosis not present

## 2021-04-11 DIAGNOSIS — F331 Major depressive disorder, recurrent, moderate: Secondary | ICD-10-CM | POA: Diagnosis not present

## 2021-04-11 DIAGNOSIS — F4310 Post-traumatic stress disorder, unspecified: Secondary | ICD-10-CM | POA: Diagnosis not present

## 2021-04-11 DIAGNOSIS — G4700 Insomnia, unspecified: Secondary | ICD-10-CM | POA: Diagnosis not present

## 2021-04-11 DIAGNOSIS — F411 Generalized anxiety disorder: Secondary | ICD-10-CM | POA: Diagnosis not present

## 2021-04-17 DIAGNOSIS — Z3183 Encounter for assisted reproductive fertility procedure cycle: Secondary | ICD-10-CM | POA: Diagnosis not present

## 2021-04-18 DIAGNOSIS — F4310 Post-traumatic stress disorder, unspecified: Secondary | ICD-10-CM | POA: Diagnosis not present

## 2021-04-18 DIAGNOSIS — F411 Generalized anxiety disorder: Secondary | ICD-10-CM | POA: Diagnosis not present

## 2021-04-18 DIAGNOSIS — F331 Major depressive disorder, recurrent, moderate: Secondary | ICD-10-CM | POA: Diagnosis not present

## 2021-04-18 DIAGNOSIS — G4700 Insomnia, unspecified: Secondary | ICD-10-CM | POA: Diagnosis not present

## 2021-04-25 DIAGNOSIS — F4310 Post-traumatic stress disorder, unspecified: Secondary | ICD-10-CM | POA: Diagnosis not present

## 2021-04-25 DIAGNOSIS — G4700 Insomnia, unspecified: Secondary | ICD-10-CM | POA: Diagnosis not present

## 2021-04-25 DIAGNOSIS — F331 Major depressive disorder, recurrent, moderate: Secondary | ICD-10-CM | POA: Diagnosis not present

## 2021-04-25 DIAGNOSIS — F411 Generalized anxiety disorder: Secondary | ICD-10-CM | POA: Diagnosis not present

## 2021-04-27 DIAGNOSIS — Z3149 Encounter for other procreative investigation and testing: Secondary | ICD-10-CM | POA: Diagnosis not present

## 2021-05-02 DIAGNOSIS — F411 Generalized anxiety disorder: Secondary | ICD-10-CM | POA: Diagnosis not present

## 2021-05-02 DIAGNOSIS — G4700 Insomnia, unspecified: Secondary | ICD-10-CM | POA: Diagnosis not present

## 2021-05-02 DIAGNOSIS — F331 Major depressive disorder, recurrent, moderate: Secondary | ICD-10-CM | POA: Diagnosis not present

## 2021-05-02 DIAGNOSIS — F4310 Post-traumatic stress disorder, unspecified: Secondary | ICD-10-CM | POA: Diagnosis not present

## 2021-05-03 DIAGNOSIS — Z3200 Encounter for pregnancy test, result unknown: Secondary | ICD-10-CM | POA: Diagnosis not present

## 2021-05-05 DIAGNOSIS — Z3201 Encounter for pregnancy test, result positive: Secondary | ICD-10-CM | POA: Diagnosis not present

## 2021-05-08 DIAGNOSIS — Z3201 Encounter for pregnancy test, result positive: Secondary | ICD-10-CM | POA: Diagnosis not present

## 2021-05-09 DIAGNOSIS — F331 Major depressive disorder, recurrent, moderate: Secondary | ICD-10-CM | POA: Diagnosis not present

## 2021-05-09 DIAGNOSIS — F4310 Post-traumatic stress disorder, unspecified: Secondary | ICD-10-CM | POA: Diagnosis not present

## 2021-05-09 DIAGNOSIS — F411 Generalized anxiety disorder: Secondary | ICD-10-CM | POA: Diagnosis not present

## 2021-05-09 DIAGNOSIS — G4700 Insomnia, unspecified: Secondary | ICD-10-CM | POA: Diagnosis not present

## 2021-05-12 DIAGNOSIS — Z3491 Encounter for supervision of normal pregnancy, unspecified, first trimester: Secondary | ICD-10-CM | POA: Diagnosis not present

## 2021-05-12 DIAGNOSIS — Z3201 Encounter for pregnancy test, result positive: Secondary | ICD-10-CM | POA: Diagnosis not present

## 2021-05-12 DIAGNOSIS — Z3A01 Less than 8 weeks gestation of pregnancy: Secondary | ICD-10-CM | POA: Diagnosis not present

## 2021-05-16 ENCOUNTER — Other Ambulatory Visit: Payer: Self-pay

## 2021-05-16 ENCOUNTER — Ambulatory Visit (INDEPENDENT_AMBULATORY_CARE_PROVIDER_SITE_OTHER): Payer: BC Managed Care – PPO | Admitting: Primary Care

## 2021-05-16 ENCOUNTER — Encounter (INDEPENDENT_AMBULATORY_CARE_PROVIDER_SITE_OTHER): Payer: Self-pay | Admitting: Primary Care

## 2021-05-16 VITALS — BP 131/91 | HR 122 | Temp 97.7°F | Ht 62.0 in | Wt 337.0 lb

## 2021-05-16 DIAGNOSIS — Z3A01 Less than 8 weeks gestation of pregnancy: Secondary | ICD-10-CM | POA: Diagnosis not present

## 2021-05-16 DIAGNOSIS — F331 Major depressive disorder, recurrent, moderate: Secondary | ICD-10-CM | POA: Diagnosis not present

## 2021-05-16 DIAGNOSIS — Z6841 Body Mass Index (BMI) 40.0 and over, adult: Secondary | ICD-10-CM | POA: Diagnosis not present

## 2021-05-16 DIAGNOSIS — F411 Generalized anxiety disorder: Secondary | ICD-10-CM | POA: Diagnosis not present

## 2021-05-16 DIAGNOSIS — F4310 Post-traumatic stress disorder, unspecified: Secondary | ICD-10-CM | POA: Diagnosis not present

## 2021-05-16 DIAGNOSIS — I10 Essential (primary) hypertension: Secondary | ICD-10-CM | POA: Diagnosis not present

## 2021-05-16 DIAGNOSIS — G4700 Insomnia, unspecified: Secondary | ICD-10-CM | POA: Diagnosis not present

## 2021-05-16 MED ORDER — NIFEDIPINE ER OSMOTIC RELEASE 30 MG PO TB24
30.0000 mg | ORAL_TABLET | Freq: Every day | ORAL | 0 refills | Status: DC
Start: 2021-05-16 — End: 2021-08-01

## 2021-05-16 NOTE — Progress Notes (Signed)
[facility]  Virtual Visit via Telephone Note  I connected with Kelly Sheppard, on 05/16/2021 at 3:04 PM by telephone and verified that I am speaking with the correct person using two identifiers.   Consent: I discussed the limitations, risks, security and privacy concerns of performing an evaluation and management service by telephone and the availability of in person appointments. I also discussed with the patient that there may be a patient responsible charge related to this service. The patient expressed understanding and agreed to proceed.   Location of Patient: Home   Location of Provider: [facility]   Persons participating in Telemedicine visit: Richardine Service,  NP Cristela Felt , CMA  History of Present Illness: [name] presented to her appt and  supposed to be on bedrest for high risk pregnancy. States was told by answering service provider was not doing tele or virtual visit. Patient sent home and visit completed over the phone   Past Medical History:  Diagnosis Date   Hypertension    Allergies  Allergen Reactions   Latex     Current Outpatient Medications on File Prior to Visit  Medication Sig Dispense Refill   amphetamine-dextroamphetamine (ADDERALL XR) 20 MG 24 hr capsule Take 20 mg by mouth daily.     clonazePAM (KLONOPIN) 1 MG tablet Take 1 mg by mouth at bedtime.     cyclobenzaprine (FLEXERIL) 5 MG tablet Take 1-2 tablets 3 times daily as needed 20 tablet 0   estradiol (ESTRACE) 2 MG tablet Take by mouth.     fluticasone furoate-vilanterol (BREO ELLIPTA) 100-25 MCG/INH AEPB Inhale 1 puff into the lungs daily. 60 each 3   labetalol (NORMODYNE) 100 MG tablet Take 1 tablet (100 mg total) by mouth 2 (two) times daily. 60 tablet 1   levothyroxine (SYNTHROID) 50 MCG tablet PLEASE SEE ATTACHED FOR DETAILED DIRECTIONS     prazosin (MINIPRESS) 2 MG capsule Take 2 mg  by mouth at bedtime.     predniSONE (DELTASONE) 5 MG tablet Take 5 mg by mouth.     progesterone (PROMETRIUM) 200 MG capsule Take by mouth.     progesterone 50 MG/ML injection SMARTSIG:Milliliter(s) IM     VENTOLIN HFA 108 (90 Base) MCG/ACT inhaler TAKE 2 PUFFS BY MOUTH EVERY 6 HOURS AS NEEDED FOR WHEEZE OR SHORTNESS OF BREATH 18 each 1   vortioxetine HBr (TRINTELLIX) 20 MG TABS tablet Take 20 mg by mouth daily.     No current facility-administered medications on file prior to visit.    Observations/Objective: BP (!) 131/91 (BP Location: Right Arm, Patient Position: Sitting, Cuff Size: Large)   Pulse (!) 122   Temp 97.7 F (36.5 C) (Temporal)   Ht 5\' 2"  (1.575 m)   Wt (!) 337 lb (152.9 kg)   LMP 03/25/2021   SpO2 96%   BMI 61.64 kg/m    Assessment and Plan: Diagnoses and all orders for this visit:  Morbid obesity (HCC) Severe Morbid Obesity  BMI 61.64  indicating an excess in caloric intake or underlining conditions. This may lead to other co-morbidities. Re-evaluate after pregnancy   Essential hypertension Counseled on blood pressure goal of less than 130/80, low-sodium, DASH diet, medication compliance, Discussed medication compliance, adverse effects.  -     NIFEdipine (PROCARDIA XL) 30 MG 24 hr tablet; Take 1 tablet (30 mg total) by mouth daily.  Less than [redacted] weeks gestation of pregnancy   Obgyn in  Dr Burman Riis [facility]   Follow Up Instructions: 2 weeks   I discussed the assessment and treatment plan with the patient. The patient was provided an opportunity to ask questions and all were answered. The patient agreed with the plan and demonstrated an understanding of the instructions.   The patient was advised to call back or seek an in-person evaluation if the symptoms worsen or if the condition fails to improve as anticipated.     I provided 15 minutes total of non-face-to-face time during this encounter including median intraservice time, reviewing  previous notes, investigations, ordering medications, medical decision making, coordinating care and patient verbalized understanding at the end of the visit.    This note has been created with Education officer, environmental. Any transcriptional errors are unintentional.   [name], NP 05/16/2021, 3:04 PM

## 2021-05-16 NOTE — Progress Notes (Deleted)
.m  Established Patient Office Visit  Subjective:  Patient ID: Kelly Sheppard, female    DOB: 01-21-1986  Age: 35 y.o. MRN: [id number]  CC: No chief complaint on file.   HPI Kelly Sheppard presents for ***  Past Medical History:  Diagnosis Date   Hypertension     No past surgical history on file.  No family history on file.  Social History   Socioeconomic History   Marital status: Single    Spouse name: Not on file   Number of children: Not on file   Years of education: Not on file   Highest education level: Not on file  Occupational History   Not on file  Tobacco Use   Smoking status: Every Day   Smokeless tobacco: Never  Substance and Sexual Activity   Alcohol use: Yes   Drug use: No   Sexual activity: Yes  Other Topics Concern   Not on file  Social History Narrative   Not on file   Social Determinants of Health   Financial Resource Strain: Not on file  Food Insecurity: Not on file  Transportation Needs: Not on file  Physical Activity: Not on file  Stress: Not on file  Social Connections: Not on file  Intimate Partner Violence: Not on file    Outpatient Medications Prior to Visit  Medication Sig Dispense Refill   amphetamine-dextroamphetamine (ADDERALL XR) 20 MG 24 hr capsule Take 20 mg by mouth daily.     clonazePAM (KLONOPIN) 1 MG tablet Take 1 mg by mouth at bedtime.     cyclobenzaprine (FLEXERIL) 5 MG tablet Take 1-2 tablets 3 times daily as needed 20 tablet 0   estradiol (ESTRACE) 2 MG tablet Take by mouth.     fluticasone furoate-vilanterol (BREO ELLIPTA) 100-25 MCG/INH AEPB Inhale 1 puff into the lungs daily. 60 each 3   labetalol (NORMODYNE) 100 MG tablet Take 1 tablet (100 mg total) by mouth 2 (two) times daily. 60 tablet 1   levothyroxine (SYNTHROID) 50 MCG tablet PLEASE SEE ATTACHED FOR DETAILED DIRECTIONS     prazosin (MINIPRESS) 2 MG capsule Take 2 mg by mouth at bedtime.     predniSONE (DELTASONE) 5 MG tablet Take 5 mg by mouth.      progesterone (PROMETRIUM) 200 MG capsule Take by mouth.     progesterone 50 MG/ML injection SMARTSIG:Milliliter(s) IM     VENTOLIN HFA 108 (90 Base) MCG/ACT inhaler TAKE 2 PUFFS BY MOUTH EVERY 6 HOURS AS NEEDED FOR WHEEZE OR SHORTNESS OF BREATH 18 each 1   vortioxetine HBr (TRINTELLIX) 20 MG TABS tablet Take 20 mg by mouth daily.     No facility-administered medications prior to visit.    Allergies  Allergen Reactions   Latex     ROS Review of Systems    Objective:   BP (!) 131/91 (BP Location: Right Arm, Patient Position: Sitting, Cuff Size: Large)   Pulse (!) 122   Temp 97.7 F (36.5 C) (Temporal)   Ht 5\' 2"  (1.575 m)   Wt (!) 337 lb (152.9 kg)   LMP 03/25/2021   SpO2 96%   BMI 61.64 kg/m   Physical Exam General: No apparent distress. Eyes: Extraocular eye movements intact, pupils equal and round. Neck: Supple, trachea midline. Thyroid: No enlargement, mobile without fixation, no tenderness. Cardiovascular: Regular rhythm and rate, no murmur, normal radial pulses. Respiratory: Normal respiratory effort, clear to auscultation. Gastrointestinal: Normal pitch active bowel sounds, nontender abdomen without distention or appreciable hepatomegaly. Neurologic: Cranial  nerves normal as tested, deep tendon reflexes ***, no tremor, *** Chvostek's sign. Musculoskeletal: Normal muscle tone, no tenderness on palpation of tibia, no excessive thoracic kyphosis. Skin: Appropriate warmth, no visible rash. Mental status: Alert, conversant, speech clear, thought logical, appropriate mood and affect, no hallucinations or delusions evident. Hematologic/lymphatic: No cervical adenopathy, no visible ecchymoses.  BP (!) 131/91 (BP Location: Right Arm, Patient Position: Sitting, Cuff Size: Large)   Pulse (!) 122   Temp 97.7 F (36.5 C) (Temporal)   Ht 5\' 2"  (1.575 m)   Wt (!) 337 lb (152.9 kg)   LMP 03/25/2021   SpO2 96%   BMI 61.64 kg/m  Wt Readings from Last 3 Encounters:  05/16/21  (!) 337 lb (152.9 kg)  05/25/20 (!) 337 lb (152.9 kg)  11/30/19 (!) 352 lb (159.7 kg)     Health Maintenance Due  Topic Date Due   COVID-19 Vaccine (1) Never done   HIV Screening  Never done   PAP SMEAR-Modifier  Never done   INFLUENZA VACCINE  03/06/2021    There are no preventive care reminders to display for this patient.  No results found for: TSH Lab Results  Component Value Date   WBC 9.4 07/08/2018   HGB 13.1 07/08/2018   HCT 41.8 07/08/2018   MCV 84.1 07/08/2018   PLT 300 07/08/2018   Lab Results  Component Value Date   NA 139 07/08/2018   K 4.0 07/08/2018   CO2 27 07/08/2018   GLUCOSE 90 07/08/2018   BUN 17 07/08/2018   CREATININE 0.81 07/08/2018   BILITOT 0.3 07/08/2018   ALKPHOS 68 07/08/2018   AST 19 07/08/2018   ALT 12 07/08/2018   PROT 7.1 07/08/2018   ALBUMIN 3.5 07/08/2018   CALCIUM 8.7 (L) 07/08/2018   ANIONGAP 5 07/08/2018   No results found for: CHOL No results found for: HDL No results found for: LDLCALC No results found for: TRIG No results found for: CHOLHDL No results found for: 14/10/2017    Assessment & Plan:   Problem List Items Addressed This Visit   None   No orders of the defined types were placed in this encounter.   Follow-up: No follow-ups on file.    [id number], NP

## 2021-05-17 ENCOUNTER — Other Ambulatory Visit: Payer: Self-pay | Admitting: Reproductive Endocrinology

## 2021-05-17 DIAGNOSIS — O0901 Supervision of pregnancy with history of infertility, first trimester: Secondary | ICD-10-CM

## 2021-05-19 ENCOUNTER — Other Ambulatory Visit: Payer: Self-pay

## 2021-05-19 ENCOUNTER — Ambulatory Visit
Admission: RE | Admit: 2021-05-19 | Discharge: 2021-05-19 | Disposition: A | Discharge status: Home / Self Care | Payer: BC Managed Care – PPO | Source: Ambulatory Visit | Attending: Reproductive Endocrinology | Admitting: Reproductive Endocrinology

## 2021-05-19 DIAGNOSIS — O0901 Supervision of pregnancy with history of infertility, first trimester: Secondary | ICD-10-CM | POA: Insufficient documentation

## 2021-05-19 DIAGNOSIS — Z3A01 Less than 8 weeks gestation of pregnancy: Secondary | ICD-10-CM | POA: Diagnosis not present

## 2021-05-19 DIAGNOSIS — Z3689 Encounter for other specified antenatal screening: Secondary | ICD-10-CM | POA: Diagnosis not present

## 2021-05-22 DIAGNOSIS — O0900 Supervision of pregnancy with history of infertility, unspecified trimester: Secondary | ICD-10-CM | POA: Diagnosis not present

## 2021-05-23 DIAGNOSIS — F331 Major depressive disorder, recurrent, moderate: Secondary | ICD-10-CM | POA: Diagnosis not present

## 2021-05-23 DIAGNOSIS — F411 Generalized anxiety disorder: Secondary | ICD-10-CM | POA: Diagnosis not present

## 2021-05-23 DIAGNOSIS — F4310 Post-traumatic stress disorder, unspecified: Secondary | ICD-10-CM | POA: Diagnosis not present

## 2021-05-23 DIAGNOSIS — G4700 Insomnia, unspecified: Secondary | ICD-10-CM | POA: Diagnosis not present

## 2021-05-26 DIAGNOSIS — O0900 Supervision of pregnancy with history of infertility, unspecified trimester: Secondary | ICD-10-CM | POA: Diagnosis not present

## 2021-05-29 ENCOUNTER — Emergency Department
Admission: EM | Admit: 2021-05-29 | Discharge: 2021-05-29 | Disposition: A | Discharge status: Home / Self Care | Payer: BC Managed Care – PPO | Attending: Emergency Medicine | Admitting: Emergency Medicine

## 2021-05-29 ENCOUNTER — Other Ambulatory Visit: Payer: Self-pay

## 2021-05-29 ENCOUNTER — Emergency Department: Payer: BC Managed Care – PPO

## 2021-05-29 DIAGNOSIS — F17200 Nicotine dependence, unspecified, uncomplicated: Secondary | ICD-10-CM | POA: Insufficient documentation

## 2021-05-29 DIAGNOSIS — Z91040 Latex allergy status: Secondary | ICD-10-CM | POA: Insufficient documentation

## 2021-05-29 DIAGNOSIS — Z3A01 Less than 8 weeks gestation of pregnancy: Secondary | ICD-10-CM | POA: Diagnosis not present

## 2021-05-29 DIAGNOSIS — N9489 Other specified conditions associated with female genital organs and menstrual cycle: Secondary | ICD-10-CM | POA: Diagnosis not present

## 2021-05-29 DIAGNOSIS — R109 Unspecified abdominal pain: Secondary | ICD-10-CM | POA: Diagnosis not present

## 2021-05-29 DIAGNOSIS — O30041 Twin pregnancy, dichorionic/diamniotic, first trimester: Secondary | ICD-10-CM | POA: Diagnosis not present

## 2021-05-29 DIAGNOSIS — O468X1 Other antepartum hemorrhage, first trimester: Secondary | ICD-10-CM

## 2021-05-29 DIAGNOSIS — O4690 Antepartum hemorrhage, unspecified, unspecified trimester: Secondary | ICD-10-CM

## 2021-05-29 DIAGNOSIS — O209 Hemorrhage in early pregnancy, unspecified: Secondary | ICD-10-CM | POA: Diagnosis not present

## 2021-05-29 DIAGNOSIS — I10 Essential (primary) hypertension: Secondary | ICD-10-CM | POA: Diagnosis not present

## 2021-05-29 DIAGNOSIS — O208 Other hemorrhage in early pregnancy: Secondary | ICD-10-CM | POA: Diagnosis not present

## 2021-05-29 DIAGNOSIS — Z79899 Other long term (current) drug therapy: Secondary | ICD-10-CM | POA: Diagnosis not present

## 2021-05-29 DIAGNOSIS — O418X10 Other specified disorders of amniotic fluid and membranes, first trimester, not applicable or unspecified: Secondary | ICD-10-CM

## 2021-05-29 LAB — COMPREHENSIVE METABOLIC PANEL
ALT: 33 U/L (ref 0–44)
AST: 17 U/L (ref 15–41)
Albumin: 2.9 g/dL — ABNORMAL LOW (ref 3.5–5.0)
Alkaline Phosphatase: 68 U/L (ref 38–126)
Anion gap: 9 (ref 5–15)
BUN: 7 mg/dL (ref 6–20)
CO2: 20 mmol/L — ABNORMAL LOW (ref 22–32)
Calcium: 8.6 mg/dL — ABNORMAL LOW (ref 8.9–10.3)
Chloride: 104 mmol/L (ref 98–111)
Creatinine, Ser: 0.56 mg/dL (ref 0.44–1.00)
GFR, Estimated: >60 mL/min (ref >60)
Glucose, Bld: 89 mg/dL (ref 70–99)
Potassium: 3.6 mmol/L (ref 3.5–5.1)
Sodium: 133 mmol/L — ABNORMAL LOW (ref 135–145)
Total Bilirubin: 0.6 mg/dL (ref 0.3–1.2)
Total Protein: 6.6 g/dL (ref 6.5–8.1)

## 2021-05-29 LAB — CBC WITH DIFFERENTIAL/PLATELET
Abs Immature Granulocytes: 0.05 10*3/uL (ref 0.00–0.07)
Basophils Absolute: 0.1 10*3/uL (ref 0.0–0.1)
Basophils Relative: 1 %
Eosinophils Absolute: 0.2 10*3/uL (ref 0.0–0.5)
Eosinophils Relative: 2 %
HCT: 38.2 % (ref 36.0–46.0)
Hemoglobin: 12.8 g/dL (ref 12.0–15.0)
Immature Granulocytes: 0 %
Lymphocytes Relative: 19 %
Lymphs Abs: 2.8 10*3/uL (ref 0.7–4.0)
MCH: 26.3 pg (ref 26.0–34.0)
MCHC: 33.5 g/dL (ref 30.0–36.0)
MCV: 78.4 fL — ABNORMAL LOW (ref 80.0–100.0)
Monocytes Absolute: 1 10*3/uL (ref 0.1–1.0)
Monocytes Relative: 7 %
Neutro Abs: 10.8 10*3/uL — ABNORMAL HIGH (ref 1.7–7.7)
Neutrophils Relative %: 71 %
Platelets: 333 10*3/uL (ref 150–400)
RBC: 4.87 MIL/uL (ref 3.87–5.11)
RDW: 14.7 % (ref 11.5–15.5)
WBC: 14.9 10*3/uL — ABNORMAL HIGH (ref 4.0–10.5)
nRBC: 0 % (ref 0.0–0.2)

## 2021-05-29 LAB — HCG, QUANTITATIVE, PREGNANCY: hCG, Beta Chain, Quant, S: 170832 m[IU]/mL — ABNORMAL HIGH (ref <5)

## 2021-05-29 NOTE — ED Provider Notes (Signed)
[facility] Emergency Department Provider Note  ____________________________________________   Event Date/Time   First MD Initiated Contact with Patient 05/29/21 1429     (approximate)  I have reviewed the triage vital signs and the nursing notes.   HISTORY  Chief Complaint Vaginal Bleeding    HPI Kelly Sheppard is a 35 y.o. female presents emergency department with vaginal bleeding in pregnancy.  Patient is pregnant with twins and is approximately [redacted] weeks pregnant.  Some cramping but no pain.  Noticed blood on the toilet paper and will bit on a pad.  Has an appointment to see her doctor on Wednesday.  Did call her doctor today but they referred her to the emergency department.  Past Medical History:  Diagnosis Date   Hypertension     There are no problems to display for this patient.   No past surgical history on file.  Prior to Admission medications   Medication Sig Start Date End Date Taking? Authorizing Provider  amphetamine-dextroamphetamine (ADDERALL XR) 20 MG 24 hr capsule Take 20 mg by mouth daily. 04/04/20   [provider]  clonazePAM (KLONOPIN) 1 MG tablet Take 1 mg by mouth at bedtime. 02/02/20   [provider]  cyclobenzaprine (FLEXERIL) 5 MG tablet Take 1-2 tablets 3 times daily as needed 11/30/19   Enid Derry, PA-C  estradiol (ESTRACE) 2 MG tablet Take by mouth. 05/02/21   [provider]  fluticasone furoate-vilanterol (BREO ELLIPTA) 100-25 MCG/INH AEPB Inhale 1 puff into the lungs daily. 05/25/20   [name], NP  levothyroxine (SYNTHROID) 50 MCG tablet PLEASE SEE ATTACHED FOR DETAILED DIRECTIONS 05/05/21   [provider]  NIFEdipine (PROCARDIA XL) 30 MG 24 hr tablet Take 1 tablet (30 mg total) by mouth daily. 05/16/21   [name], NP  prazosin (MINIPRESS) 2 MG capsule Take 2 mg by mouth at bedtime. 09/17/19   [provider]  predniSONE (DELTASONE) 5 MG tablet Take 5  mg by mouth. 05/02/21   [provider]  progesterone (PROMETRIUM) 200 MG capsule Take by mouth. 05/02/21   [provider]  progesterone 50 MG/ML injection SMARTSIG:Milliliter(s) IM 05/02/21   [provider]  VENTOLIN HFA 108 (90 Base) MCG/ACT inhaler TAKE 2 PUFFS BY MOUTH EVERY 6 HOURS AS NEEDED FOR WHEEZE OR SHORTNESS OF BREATH 03/18/21   Hoy Register, MD  vortioxetine HBr (TRINTELLIX) 20 MG TABS tablet Take 20 mg by mouth daily.    [provider]    Allergies Latex  No family history on file.  Social History Social History   Tobacco Use   Smoking status: Every Day   Smokeless tobacco: Never  Substance Use Topics   Alcohol use: Yes   Drug use: No    Review of Systems  Constitutional: No fever/chills Eyes: No visual changes. ENT: No sore throat. Respiratory: Denies cough Cardiovascular: Denies chest pain Gastrointestinal: Denies abdominal pain Genitourinary: Negative for dysuria. Musculoskeletal: Negative for back pain. Skin: Negative for rash. Psychiatric: no mood changes,     ____________________________________________   PHYSICAL EXAM:  VITAL SIGNS: ED Triage Vitals  Enc Vitals Group     BP 05/29/21 1128 (!) 133/107     Pulse Rate 05/29/21 1128 97     Resp 05/29/21 1128 18     Temp 05/29/21 1128 98.6 F (37 C)     Temp Source 05/29/21 1128 Oral     SpO2 05/29/21 1128 99 %     Weight 05/29/21 1129 (!)  337 lb (152.9 kg)     Height 05/29/21 1129 5\' 2"  (1.575 m)     Head Circumference --      Peak Flow --      Pain Score 05/29/21 1128 1     Pain Loc --      Pain Edu? --      Excl. in GC? --     Constitutional: Alert and oriented. Well appearing and in no acute distress. Eyes: Conjunctivae are normal.  Head: Atraumatic. Nose: No congestion/rhinnorhea. Mouth/Throat: Mucous membranes are moist.   Neck:  supple no lymphadenopathy noted Cardiovascular: Normal rate, regular rhythm.  Respiratory: Normal respiratory  effort.  No retractions, l Abd: soft nontender bs normal all 4 quad GU: deferred Musculoskeletal: FROM all extremities, warm and well perfused Neurologic:  Normal speech and language.  Skin:  Skin is warm, dry and intact. No rash noted. Psychiatric: Mood and affect are normal. Speech and behavior are normal.  ____________________________________________   LABS (all labs ordered are listed, but only abnormal results are displayed)  Labs Reviewed  COMPREHENSIVE METABOLIC PANEL - Abnormal; Notable for the following components:      Result Value   Sodium 133 (*)    CO2 20 (*)    Calcium 8.6 (*)    Albumin 2.9 (*)    All other components within normal limits  CBC WITH DIFFERENTIAL/PLATELET - Abnormal; Notable for the following components:   WBC 14.9 (*)    MCV 78.4 (*)    Neutro Abs 10.8 (*)    All other components within normal limits  HCG, QUANTITATIVE, PREGNANCY - Abnormal; Notable for the following components:   hCG, Beta Chain, Quant, S 05/31/21 (*)    All other components within normal limits  URINALYSIS, ROUTINE W REFLEX MICROSCOPIC  POC URINE PREG, ED   ____________________________________________   ____________________________________________  RADIOLOGY  Ultrasound OB less than 14 weeks  ____________________________________________   PROCEDURES  Procedure(s) performed: No  Procedures    ____________________________________________   INITIAL IMPRESSION / ASSESSMENT AND PLAN / ED COURSE  Pertinent labs & imaging results that were available during my care of the patient were reviewed by me and considered in my medical decision making (see chart for details).   The patient is a 35 year old female presents with vaginal bleeding pregnancy.  See HPI.  Physical exam shows patient appears stable  DDx: Subchorionic hemorrhage, vaginal bleeding pregnancy, threatened miscarriage, miscarriage  Beta-hCG is 170,832, comprehensive metabolic panel is normal, CBC is  normal  Ultrasound OB less than 14 weeks shows 2 IUP's.  Subchorionic hemorrhage noted which is small.  This was reviewed by me and confirmed by radiology  I did explain findings to the patient.  She is to do pelvic rest.  Follow-up with her regular doctor for your already scheduled appointment this week.  Return emergency department worsening.  She is in agreement treatment plan.  Discharged in stable condition     Kelly Sheppard was evaluated in Emergency Department on 05/29/2021 for the symptoms described in the history of present illness. She was evaluated in the context of the global COVID-19 pandemic, which necessitated consideration that the patient might be at risk for infection with the SARS-CoV-2 virus that causes COVID-19. Institutional protocols and algorithms that pertain to the evaluation of patients at risk for COVID-19 are in a state of rapid change based on information released by regulatory bodies including the CDC and federal and state organizations. These policies and algorithms were followed during the patient's  care in the ED.    As part of my medical decision making, I reviewed the following data within the electronic MEDICAL RECORD NUMBER Nursing notes reviewed and incorporated, Labs reviewed , Old chart reviewed, Radiograph reviewed , Notes from prior ED visits, and [place] Controlled Substance Database  ____________________________________________   FINAL CLINICAL IMPRESSION(S) / ED DIAGNOSES  Final diagnoses:  Vaginal bleeding in pregnancy  Subchorionic hematoma in first trimester, single or unspecified fetus      NEW MEDICATIONS STARTED DURING THIS VISIT:  New Prescriptions   No medications on file     Note:  This document was prepared using Dragon voice recognition software and may include unintentional dictation errors.    Faythe Ghee, PA-C 05/29/21 1441    Gilles Chiquito, MD 05/29/21 [phone]

## 2021-05-29 NOTE — ED Triage Notes (Signed)
Pt here with vaginal bleeding this morning. Pt states that she is currently [redacted] weeks pregnant with twins. Pt states that she has bled a small amount but there is a larger amount of blood when she wipes. Pt in NAD in triage Pt also c/o cramps.

## 2021-05-29 NOTE — Discharge Instructions (Addendum)
Follow up with your regular doctor Return if worsening 

## 2021-05-29 NOTE — ED Provider Notes (Signed)
Emergency Medicine Provider Triage Evaluation Note  Kelly Sheppard , a 35 y.o. female  was evaluated in triage.  Pt complains of vaginal bleeding.  Patient presents with twins, roughly [redacted] weeks pregnant.  Patient began to have vaginal bleeding this morning.  She had some cramping but no frank pain.  No urinary changes.  No fevers.  Patient called her OB/GYN and was referred to the ED.  Review of Systems  Positive: Vaginal bleeding in pregnancy, [redacted] weeks pregnant with twins Negative: Abdominal pain, urinary changes, fevers or chills, nausea vomiting, diarrhea or constipation  Physical Exam  BP (!) 133/107 (BP Location: Left Arm)   Pulse 97   Temp 98.6 F (37 C) (Oral)   Resp 18   Ht 5\' 2"  (1.575 m)   Wt (!) 152.9 kg   LMP 03/25/2021   SpO2 99%   BMI 61.64 kg/m  Gen:   Awake, no distress   Resp:  Normal effort  MSK:   Moves extremities without difficulty  Other:  Bowel sounds x4 quadrants.  Nontender on palpation.  Medical Decision Making  Medically screening exam initiated at 11:33 AM.  Appropriate orders placed.  Kelly Sheppard was informed that the remainder of the evaluation will be completed by another provider, this initial triage assessment does not replace that evaluation, and the importance of remaining in the ED until their evaluation is complete.  Patient arrived with vaginal bleeding in pregnancy.  Patient has twins, roughly [redacted] weeks pregnant.  Started with vaginal bleeding this morning.  Patient was referred to the ED by OB/GYN.  Labs, ultrasound ordered at this time   Bettey Mare 05/29/21 1133    05/31/21, MD 05/29/21 1146

## 2021-05-30 ENCOUNTER — Encounter (INDEPENDENT_AMBULATORY_CARE_PROVIDER_SITE_OTHER): Payer: Self-pay | Admitting: Primary Care

## 2021-05-30 ENCOUNTER — Ambulatory Visit (INDEPENDENT_AMBULATORY_CARE_PROVIDER_SITE_OTHER): Payer: BC Managed Care – PPO | Admitting: Primary Care

## 2021-05-30 VITALS — BP 124/78

## 2021-05-30 DIAGNOSIS — G4700 Insomnia, unspecified: Secondary | ICD-10-CM | POA: Diagnosis not present

## 2021-05-30 DIAGNOSIS — I10 Essential (primary) hypertension: Secondary | ICD-10-CM | POA: Diagnosis not present

## 2021-05-30 DIAGNOSIS — F331 Major depressive disorder, recurrent, moderate: Secondary | ICD-10-CM | POA: Diagnosis not present

## 2021-05-30 DIAGNOSIS — F411 Generalized anxiety disorder: Secondary | ICD-10-CM | POA: Diagnosis not present

## 2021-05-30 DIAGNOSIS — F4310 Post-traumatic stress disorder, unspecified: Secondary | ICD-10-CM | POA: Diagnosis not present

## 2021-05-30 NOTE — Progress Notes (Signed)
[facility]  Virtual Visit via Telephone Note  I connected with Kelly Sheppard, on 05/30/2021 at 4:14 PM through  telephone and verified that I am speaking with the correct person using two identifiers.   Consent: I discussed the limitations, risks, security and privacy concerns of performing an evaluation and management service by telephone and the availability of in person appointments. I also discussed with the patient that there may be a patient responsible charge related to this service. The patient expressed understanding and agreed to proceed.   Location of Patient: Home  Location of Provider: [facility]   Persons participating in Telemedicine visit: Richardine Service,  NP      History of Present Illness: Kelly Sheppard is a 35 year old pregnant female with twins on bedrest following up on hypertension.  She denies shortness of breath, headaches, chest pain or lower extremity edema    Past Medical History:  Diagnosis Date   Hypertension    Allergies  Allergen Reactions   Latex     Current Outpatient Medications on File Prior to Visit  Medication Sig Dispense Refill   estradiol (ESTRACE) 2 MG tablet Take by mouth.     fluticasone furoate-vilanterol (BREO ELLIPTA) 100-25 MCG/INH AEPB Inhale 1 puff into the lungs daily. 60 each 3   levothyroxine (SYNTHROID) 50 MCG tablet PLEASE SEE ATTACHED FOR DETAILED DIRECTIONS     NIFEdipine (PROCARDIA XL) 30 MG 24 hr tablet Take 1 tablet (30 mg total) by mouth daily. 90 tablet 0   prazosin (MINIPRESS) 2 MG capsule Take 2 mg by mouth at bedtime.     progesterone (PROMETRIUM) 200 MG capsule Take by mouth.     progesterone 50 MG/ML injection SMARTSIG:Milliliter(s) IM     VENTOLIN HFA 108 (90 Base) MCG/ACT inhaler TAKE 2 PUFFS BY MOUTH EVERY 6 HOURS AS NEEDED FOR WHEEZE OR SHORTNESS OF BREATH 18 each 1   amphetamine-dextroamphetamine  (ADDERALL XR) 20 MG 24 hr capsule Take 20 mg by mouth daily. (Patient not taking: Reported on 05/30/2021)     clonazePAM (KLONOPIN) 1 MG tablet Take 1 mg by mouth at bedtime. (Patient not taking: Reported on 05/30/2021)     cyclobenzaprine (FLEXERIL) 5 MG tablet Take 1-2 tablets 3 times daily as needed (Patient not taking: Reported on 05/30/2021) 20 tablet 0   predniSONE (DELTASONE) 5 MG tablet Take 5 mg by mouth.     vortioxetine HBr (TRINTELLIX) 20 MG TABS tablet Take 20 mg by mouth daily. (Patient not taking: Reported on 05/30/2021)     No current facility-administered medications on file prior to visit.    Observations/Objective: BP 124/78 Comment: taken at home with monitor  LMP 03/25/2021    Assessment and Plan: Kelly Sheppard was seen today for hypertension.  Diagnoses and all orders for this visit:  Essential hypertension Patient has high risk pregnancy with hypertension on Procardia 30 mg XL daily. Check blood pressure daily at the same time. Keep a log of all blood pressure readings.  DASH DIET; No salt or low sodium diet  If pressure if greater than 150/100 notify me here at the office.  If it's persistently greater 180/90, go to the Emergency Department.  Take all medication as prescribed.   Avoid smoked meats which are high in sodium content.  Avoid soda which contains sodium and are high in sugar which increases your risk for diabetes.   Follow Up Instructions: Appointment scheduled.  All appointments will  be via tele   I discussed the assessment and treatment plan with the patient. The patient was provided an opportunity to ask questions and all were answered. The patient agreed with the plan and demonstrated an understanding of the instructions.   The patient was advised to call back or seek an in-person evaluation if the symptoms worsen or if the condition fails to improve as anticipated.     I provided 12 minutes total of non-face-to-face time during this  encounter including median intraservice time, reviewing previous notes, investigations, ordering medications, medical decision making, coordinating care and patient verbalized understanding at the end of the visit.    This note has been created with Education officer, environmental. Any transcriptional errors are unintentional.   [name], NP 05/30/2021, 4:14 PM

## 2021-05-30 NOTE — Progress Notes (Signed)
F/u HTN Was seen at hospital on yesterday states it was elevated.

## 2021-05-31 DIAGNOSIS — O99891 Other specified diseases and conditions complicating pregnancy: Secondary | ICD-10-CM | POA: Diagnosis not present

## 2021-05-31 DIAGNOSIS — Z3A08 8 weeks gestation of pregnancy: Secondary | ICD-10-CM | POA: Diagnosis not present

## 2021-05-31 DIAGNOSIS — O0991 Supervision of high risk pregnancy, unspecified, first trimester: Secondary | ICD-10-CM | POA: Diagnosis not present

## 2021-05-31 DIAGNOSIS — Z369 Encounter for antenatal screening, unspecified: Secondary | ICD-10-CM | POA: Diagnosis not present

## 2021-05-31 DIAGNOSIS — Z3689 Encounter for other specified antenatal screening: Secondary | ICD-10-CM | POA: Diagnosis not present

## 2021-05-31 DIAGNOSIS — B019 Varicella without complication: Secondary | ICD-10-CM | POA: Diagnosis not present

## 2021-05-31 DIAGNOSIS — O30041 Twin pregnancy, dichorionic/diamniotic, first trimester: Secondary | ICD-10-CM | POA: Diagnosis not present

## 2021-06-02 DIAGNOSIS — F4310 Post-traumatic stress disorder, unspecified: Secondary | ICD-10-CM | POA: Diagnosis not present

## 2021-06-02 DIAGNOSIS — F331 Major depressive disorder, recurrent, moderate: Secondary | ICD-10-CM | POA: Diagnosis not present

## 2021-06-02 DIAGNOSIS — F411 Generalized anxiety disorder: Secondary | ICD-10-CM | POA: Diagnosis not present

## 2021-06-06 DIAGNOSIS — F411 Generalized anxiety disorder: Secondary | ICD-10-CM | POA: Diagnosis not present

## 2021-06-06 DIAGNOSIS — F331 Major depressive disorder, recurrent, moderate: Secondary | ICD-10-CM | POA: Diagnosis not present

## 2021-06-06 DIAGNOSIS — F4310 Post-traumatic stress disorder, unspecified: Secondary | ICD-10-CM | POA: Diagnosis not present

## 2021-06-06 DIAGNOSIS — O0991 Supervision of high risk pregnancy, unspecified, first trimester: Secondary | ICD-10-CM | POA: Diagnosis not present

## 2021-06-06 DIAGNOSIS — G4700 Insomnia, unspecified: Secondary | ICD-10-CM | POA: Diagnosis not present

## 2021-06-13 DIAGNOSIS — F411 Generalized anxiety disorder: Secondary | ICD-10-CM | POA: Diagnosis not present

## 2021-06-13 DIAGNOSIS — G4700 Insomnia, unspecified: Secondary | ICD-10-CM | POA: Diagnosis not present

## 2021-06-13 DIAGNOSIS — F4310 Post-traumatic stress disorder, unspecified: Secondary | ICD-10-CM | POA: Diagnosis not present

## 2021-06-13 DIAGNOSIS — F331 Major depressive disorder, recurrent, moderate: Secondary | ICD-10-CM | POA: Diagnosis not present

## 2021-06-19 DIAGNOSIS — J4520 Mild intermittent asthma, uncomplicated: Secondary | ICD-10-CM | POA: Diagnosis not present

## 2021-06-19 DIAGNOSIS — Z3A10 10 weeks gestation of pregnancy: Secondary | ICD-10-CM | POA: Diagnosis not present

## 2021-06-19 DIAGNOSIS — Z6841 Body Mass Index (BMI) 40.0 and over, adult: Secondary | ICD-10-CM | POA: Diagnosis not present

## 2021-06-19 DIAGNOSIS — O0991 Supervision of high risk pregnancy, unspecified, first trimester: Secondary | ICD-10-CM | POA: Diagnosis not present

## 2021-06-19 DIAGNOSIS — R7301 Impaired fasting glucose: Secondary | ICD-10-CM | POA: Diagnosis not present

## 2021-06-27 DIAGNOSIS — F411 Generalized anxiety disorder: Secondary | ICD-10-CM | POA: Diagnosis not present

## 2021-06-27 DIAGNOSIS — G4700 Insomnia, unspecified: Secondary | ICD-10-CM | POA: Diagnosis not present

## 2021-06-27 DIAGNOSIS — F4310 Post-traumatic stress disorder, unspecified: Secondary | ICD-10-CM | POA: Diagnosis not present

## 2021-06-27 DIAGNOSIS — F331 Major depressive disorder, recurrent, moderate: Secondary | ICD-10-CM | POA: Diagnosis not present

## 2021-07-04 DIAGNOSIS — F4310 Post-traumatic stress disorder, unspecified: Secondary | ICD-10-CM | POA: Diagnosis not present

## 2021-07-04 DIAGNOSIS — F900 Attention-deficit hyperactivity disorder, predominantly inattentive type: Secondary | ICD-10-CM | POA: Diagnosis not present

## 2021-07-04 DIAGNOSIS — F331 Major depressive disorder, recurrent, moderate: Secondary | ICD-10-CM | POA: Diagnosis not present

## 2021-07-04 DIAGNOSIS — F411 Generalized anxiety disorder: Secondary | ICD-10-CM | POA: Diagnosis not present

## 2021-07-10 DIAGNOSIS — R7303 Prediabetes: Secondary | ICD-10-CM | POA: Diagnosis not present

## 2021-07-10 DIAGNOSIS — F32A Depression, unspecified: Secondary | ICD-10-CM | POA: Diagnosis not present

## 2021-07-10 DIAGNOSIS — F419 Anxiety disorder, unspecified: Secondary | ICD-10-CM | POA: Diagnosis not present

## 2021-07-10 DIAGNOSIS — O99891 Other specified diseases and conditions complicating pregnancy: Secondary | ICD-10-CM | POA: Diagnosis not present

## 2021-07-10 DIAGNOSIS — Z3689 Encounter for other specified antenatal screening: Secondary | ICD-10-CM | POA: Diagnosis not present

## 2021-07-10 DIAGNOSIS — O99341 Other mental disorders complicating pregnancy, first trimester: Secondary | ICD-10-CM | POA: Diagnosis not present

## 2021-07-10 DIAGNOSIS — J4520 Mild intermittent asthma, uncomplicated: Secondary | ICD-10-CM | POA: Diagnosis not present

## 2021-07-10 DIAGNOSIS — Z7901 Long term (current) use of anticoagulants: Secondary | ICD-10-CM | POA: Diagnosis not present

## 2021-07-10 DIAGNOSIS — Z3A13 13 weeks gestation of pregnancy: Secondary | ICD-10-CM | POA: Diagnosis not present

## 2021-07-10 DIAGNOSIS — Z3482 Encounter for supervision of other normal pregnancy, second trimester: Secondary | ICD-10-CM | POA: Diagnosis not present

## 2021-07-10 DIAGNOSIS — O10911 Unspecified pre-existing hypertension complicating pregnancy, first trimester: Secondary | ICD-10-CM | POA: Diagnosis not present

## 2021-07-10 DIAGNOSIS — O99211 Obesity complicating pregnancy, first trimester: Secondary | ICD-10-CM | POA: Diagnosis not present

## 2021-07-10 DIAGNOSIS — O99511 Diseases of the respiratory system complicating pregnancy, first trimester: Secondary | ICD-10-CM | POA: Diagnosis not present

## 2021-07-10 DIAGNOSIS — O0991 Supervision of high risk pregnancy, unspecified, first trimester: Secondary | ICD-10-CM | POA: Diagnosis not present

## 2021-07-10 DIAGNOSIS — O30041 Twin pregnancy, dichorionic/diamniotic, first trimester: Secondary | ICD-10-CM | POA: Diagnosis not present

## 2021-07-11 DIAGNOSIS — G4700 Insomnia, unspecified: Secondary | ICD-10-CM | POA: Diagnosis not present

## 2021-07-11 DIAGNOSIS — F411 Generalized anxiety disorder: Secondary | ICD-10-CM | POA: Diagnosis not present

## 2021-07-11 DIAGNOSIS — F4310 Post-traumatic stress disorder, unspecified: Secondary | ICD-10-CM | POA: Diagnosis not present

## 2021-07-11 DIAGNOSIS — F331 Major depressive disorder, recurrent, moderate: Secondary | ICD-10-CM | POA: Diagnosis not present

## 2021-08-01 ENCOUNTER — Other Ambulatory Visit: Payer: Self-pay

## 2021-08-01 ENCOUNTER — Encounter (INDEPENDENT_AMBULATORY_CARE_PROVIDER_SITE_OTHER): Payer: Self-pay | Admitting: Primary Care

## 2021-08-01 ENCOUNTER — Ambulatory Visit (INDEPENDENT_AMBULATORY_CARE_PROVIDER_SITE_OTHER): Payer: BC Managed Care – PPO | Admitting: Primary Care

## 2021-08-01 DIAGNOSIS — I10 Essential (primary) hypertension: Secondary | ICD-10-CM

## 2021-08-01 MED ORDER — NIFEDIPINE ER OSMOTIC RELEASE 30 MG PO TB24: 30.0000 mg | ORAL_TABLET | Freq: Every day | ORAL | 0 refills | Status: DC

## 2021-08-01 NOTE — Progress Notes (Signed)
°  [facility] Telephone Note  I connected with Kelly Sheppard, on 08/01/2021 at 4:19 PM through telephone and verified that I am speaking with the correct person using two identifiers.   Consent: I discussed the limitations, risks, security and privacy concerns of performing an evaluation and management service by telephone and the availability of in person appointments. I also discussed with the patient that there may be a patient responsible charge related to this service. The patient expressed understanding and agreed to proceed.   Location of Patient: Home  Location of Provider: [facility]   Persons participating in Telemedicine visit: Richardine Service,  NP Cristela Felt , CMA  History of Present Illness: Kelly Sheppard is 35 year old [redacted] week pregnant female having twins -girl and boy following up on HTN- she takes Bp at home and reading are systolic 122-134 and diastolic 76-90. Denies shortness of breath, headaches, chest pain or lower extremity edema . She is off of bedrest.  Past Medical History:  Diagnosis Date   Hypertension    Allergies  Allergen Reactions   Latex     Current Outpatient Medications on File Prior to Visit  Medication Sig Dispense Refill   NIFEdipine (PROCARDIA XL) 30 MG 24 hr tablet Take 1 tablet (30 mg total) by mouth daily. 90 tablet 0   levothyroxine (SYNTHROID) 50 MCG tablet PLEASE SEE ATTACHED FOR DETAILED DIRECTIONS (Patient not taking: Reported on 08/01/2021)     VENTOLIN HFA 108 (90 Base) MCG/ACT inhaler TAKE 2 PUFFS BY MOUTH EVERY 6 HOURS AS NEEDED FOR WHEEZE OR SHORTNESS OF BREATH 18 each 1   No current facility-administered medications on file prior to visit.    Observations/Objective: LMP 03/25/2021  Bp noted in HPI  Assessment and Plan: Kelly Sheppard was seen today for blood pressure check.  Diagnoses and all orders for this visit:  Essential  hypertension Blood pressure has been improving and now off of bedrest and able to move around should also, improve Bp. -     NIFEdipine (PROCARDIA XL) 30 MG 24 hr tablet; Take 1 tablet (30 mg total) by mouth daily.     Follow Up Instructions: If Bp remains elevated 150/90's call office for re-eval on Bp and medication   I discussed the assessment and treatment plan with the patient. The patient was provided an opportunity to ask questions and all were answered. The patient agreed with the plan and demonstrated an understanding of the instructions.   The patient was advised to call back or seek an in-person evaluation if the symptoms worsen or if the condition fails to improve as anticipated.     I provided 10 minutes total of non-face-to-face time during this encounter including median intraservice time, reviewing previous notes, investigations, ordering medications, medical decision making, coordinating care and patient verbalized understanding at the end of the visit.    This note has been created with Education officer, environmental. Any transcriptional errors are unintentional.   [name], NP 08/01/2021, 4:19 PM

## 2021-08-01 NOTE — Progress Notes (Signed)
I connected with  Kelly Sheppard on 08/01/21 by a video enabled telemedicine application and verified that I am speaking with the correct person using two identifiers.   I discussed the limitations of evaluation and management by telemedicine. The patient expressed understanding and agreed to proceed.   Taking BP medications daily Bp today 112/84

## 2021-08-02 ENCOUNTER — Encounter (INDEPENDENT_AMBULATORY_CARE_PROVIDER_SITE_OTHER): Payer: Self-pay | Admitting: Primary Care

## 2021-08-08 DIAGNOSIS — F331 Major depressive disorder, recurrent, moderate: Secondary | ICD-10-CM | POA: Diagnosis not present

## 2021-08-08 DIAGNOSIS — F411 Generalized anxiety disorder: Secondary | ICD-10-CM | POA: Diagnosis not present

## 2021-08-08 DIAGNOSIS — F4310 Post-traumatic stress disorder, unspecified: Secondary | ICD-10-CM | POA: Diagnosis not present

## 2021-08-14 DIAGNOSIS — J45909 Unspecified asthma, uncomplicated: Secondary | ICD-10-CM | POA: Diagnosis not present

## 2021-08-14 DIAGNOSIS — Z3A18 18 weeks gestation of pregnancy: Secondary | ICD-10-CM | POA: Diagnosis not present

## 2021-08-14 DIAGNOSIS — O09891 Supervision of other high risk pregnancies, first trimester: Secondary | ICD-10-CM | POA: Diagnosis not present

## 2021-08-14 DIAGNOSIS — F419 Anxiety disorder, unspecified: Secondary | ICD-10-CM | POA: Diagnosis not present

## 2021-08-14 DIAGNOSIS — O30042 Twin pregnancy, dichorionic/diamniotic, second trimester: Secondary | ICD-10-CM | POA: Diagnosis not present

## 2021-08-14 DIAGNOSIS — O219 Vomiting of pregnancy, unspecified: Secondary | ICD-10-CM | POA: Diagnosis not present

## 2021-08-14 DIAGNOSIS — O99519 Diseases of the respiratory system complicating pregnancy, unspecified trimester: Secondary | ICD-10-CM | POA: Diagnosis not present

## 2021-08-14 DIAGNOSIS — O09811 Supervision of pregnancy resulting from assisted reproductive technology, first trimester: Secondary | ICD-10-CM | POA: Diagnosis not present

## 2021-08-14 DIAGNOSIS — O0992 Supervision of high risk pregnancy, unspecified, second trimester: Secondary | ICD-10-CM | POA: Diagnosis not present

## 2021-08-14 DIAGNOSIS — O99210 Obesity complicating pregnancy, unspecified trimester: Secondary | ICD-10-CM | POA: Diagnosis not present

## 2021-08-14 DIAGNOSIS — O30041 Twin pregnancy, dichorionic/diamniotic, first trimester: Secondary | ICD-10-CM | POA: Diagnosis not present

## 2021-08-14 DIAGNOSIS — O99340 Other mental disorders complicating pregnancy, unspecified trimester: Secondary | ICD-10-CM | POA: Diagnosis not present

## 2021-08-14 DIAGNOSIS — Z3689 Encounter for other specified antenatal screening: Secondary | ICD-10-CM | POA: Diagnosis not present

## 2021-08-14 DIAGNOSIS — Z6841 Body Mass Index (BMI) 40.0 and over, adult: Secondary | ICD-10-CM | POA: Diagnosis not present

## 2021-08-14 DIAGNOSIS — O10919 Unspecified pre-existing hypertension complicating pregnancy, unspecified trimester: Secondary | ICD-10-CM | POA: Diagnosis not present

## 2021-08-15 DIAGNOSIS — F411 Generalized anxiety disorder: Secondary | ICD-10-CM | POA: Diagnosis not present

## 2021-08-15 DIAGNOSIS — F331 Major depressive disorder, recurrent, moderate: Secondary | ICD-10-CM | POA: Diagnosis not present

## 2021-08-22 DIAGNOSIS — F900 Attention-deficit hyperactivity disorder, predominantly inattentive type: Secondary | ICD-10-CM | POA: Diagnosis not present

## 2021-08-22 DIAGNOSIS — F4310 Post-traumatic stress disorder, unspecified: Secondary | ICD-10-CM | POA: Diagnosis not present

## 2021-08-22 DIAGNOSIS — F331 Major depressive disorder, recurrent, moderate: Secondary | ICD-10-CM | POA: Diagnosis not present

## 2021-08-22 DIAGNOSIS — F411 Generalized anxiety disorder: Secondary | ICD-10-CM | POA: Diagnosis not present

## 2021-08-29 DIAGNOSIS — F331 Major depressive disorder, recurrent, moderate: Secondary | ICD-10-CM | POA: Diagnosis not present

## 2021-08-29 DIAGNOSIS — F900 Attention-deficit hyperactivity disorder, predominantly inattentive type: Secondary | ICD-10-CM | POA: Diagnosis not present

## 2021-08-29 DIAGNOSIS — F411 Generalized anxiety disorder: Secondary | ICD-10-CM | POA: Diagnosis not present

## 2021-08-29 DIAGNOSIS — F4310 Post-traumatic stress disorder, unspecified: Secondary | ICD-10-CM | POA: Diagnosis not present

## 2021-08-30 DIAGNOSIS — F419 Anxiety disorder, unspecified: Secondary | ICD-10-CM | POA: Diagnosis not present

## 2021-08-30 DIAGNOSIS — O99012 Anemia complicating pregnancy, second trimester: Secondary | ICD-10-CM | POA: Diagnosis not present

## 2021-08-30 DIAGNOSIS — Z87891 Personal history of nicotine dependence: Secondary | ICD-10-CM | POA: Diagnosis not present

## 2021-08-30 DIAGNOSIS — O10912 Unspecified pre-existing hypertension complicating pregnancy, second trimester: Secondary | ICD-10-CM | POA: Diagnosis not present

## 2021-08-30 DIAGNOSIS — Z3A21 21 weeks gestation of pregnancy: Secondary | ICD-10-CM | POA: Diagnosis not present

## 2021-08-30 DIAGNOSIS — R748 Abnormal levels of other serum enzymes: Secondary | ICD-10-CM | POA: Diagnosis not present

## 2021-08-30 DIAGNOSIS — O99512 Diseases of the respiratory system complicating pregnancy, second trimester: Secondary | ICD-10-CM | POA: Diagnosis not present

## 2021-08-30 DIAGNOSIS — R42 Dizziness and giddiness: Secondary | ICD-10-CM | POA: Diagnosis not present

## 2021-08-30 DIAGNOSIS — F32A Depression, unspecified: Secondary | ICD-10-CM | POA: Diagnosis not present

## 2021-08-30 DIAGNOSIS — R5383 Other fatigue: Secondary | ICD-10-CM | POA: Diagnosis not present

## 2021-08-30 DIAGNOSIS — R7303 Prediabetes: Secondary | ICD-10-CM | POA: Diagnosis not present

## 2021-08-30 DIAGNOSIS — O30042 Twin pregnancy, dichorionic/diamniotic, second trimester: Secondary | ICD-10-CM | POA: Diagnosis not present

## 2021-08-30 DIAGNOSIS — O99891 Other specified diseases and conditions complicating pregnancy: Secondary | ICD-10-CM | POA: Diagnosis not present

## 2021-08-30 DIAGNOSIS — O99342 Other mental disorders complicating pregnancy, second trimester: Secondary | ICD-10-CM | POA: Diagnosis not present

## 2021-08-30 DIAGNOSIS — J4520 Mild intermittent asthma, uncomplicated: Secondary | ICD-10-CM | POA: Diagnosis not present

## 2021-08-30 DIAGNOSIS — D509 Iron deficiency anemia, unspecified: Secondary | ICD-10-CM | POA: Diagnosis not present

## 2021-08-31 DIAGNOSIS — R42 Dizziness and giddiness: Secondary | ICD-10-CM | POA: Diagnosis not present

## 2021-08-31 DIAGNOSIS — R5383 Other fatigue: Secondary | ICD-10-CM | POA: Diagnosis not present

## 2021-08-31 DIAGNOSIS — R7303 Prediabetes: Secondary | ICD-10-CM | POA: Diagnosis not present

## 2021-08-31 DIAGNOSIS — R748 Abnormal levels of other serum enzymes: Secondary | ICD-10-CM | POA: Diagnosis not present

## 2021-08-31 DIAGNOSIS — O99512 Diseases of the respiratory system complicating pregnancy, second trimester: Secondary | ICD-10-CM | POA: Diagnosis not present

## 2021-08-31 DIAGNOSIS — Z3A21 21 weeks gestation of pregnancy: Secondary | ICD-10-CM | POA: Diagnosis not present

## 2021-08-31 DIAGNOSIS — Z87891 Personal history of nicotine dependence: Secondary | ICD-10-CM | POA: Diagnosis not present

## 2021-08-31 DIAGNOSIS — O30042 Twin pregnancy, dichorionic/diamniotic, second trimester: Secondary | ICD-10-CM | POA: Diagnosis not present

## 2021-08-31 DIAGNOSIS — O99891 Other specified diseases and conditions complicating pregnancy: Secondary | ICD-10-CM | POA: Diagnosis not present

## 2021-08-31 DIAGNOSIS — F419 Anxiety disorder, unspecified: Secondary | ICD-10-CM | POA: Diagnosis not present

## 2021-08-31 DIAGNOSIS — D509 Iron deficiency anemia, unspecified: Secondary | ICD-10-CM | POA: Diagnosis not present

## 2021-08-31 DIAGNOSIS — O99342 Other mental disorders complicating pregnancy, second trimester: Secondary | ICD-10-CM | POA: Diagnosis not present

## 2021-08-31 DIAGNOSIS — F32A Depression, unspecified: Secondary | ICD-10-CM | POA: Diagnosis not present

## 2021-08-31 DIAGNOSIS — J4520 Mild intermittent asthma, uncomplicated: Secondary | ICD-10-CM | POA: Diagnosis not present

## 2021-08-31 DIAGNOSIS — O10912 Unspecified pre-existing hypertension complicating pregnancy, second trimester: Secondary | ICD-10-CM | POA: Diagnosis not present

## 2021-08-31 DIAGNOSIS — O99012 Anemia complicating pregnancy, second trimester: Secondary | ICD-10-CM | POA: Diagnosis not present

## 2021-09-05 DIAGNOSIS — F331 Major depressive disorder, recurrent, moderate: Secondary | ICD-10-CM | POA: Diagnosis not present

## 2021-09-05 DIAGNOSIS — F411 Generalized anxiety disorder: Secondary | ICD-10-CM | POA: Diagnosis not present

## 2021-09-05 DIAGNOSIS — F900 Attention-deficit hyperactivity disorder, predominantly inattentive type: Secondary | ICD-10-CM | POA: Diagnosis not present

## 2021-09-05 DIAGNOSIS — F4310 Post-traumatic stress disorder, unspecified: Secondary | ICD-10-CM | POA: Diagnosis not present

## 2021-09-11 DIAGNOSIS — G479 Sleep disorder, unspecified: Secondary | ICD-10-CM | POA: Diagnosis not present

## 2021-09-11 DIAGNOSIS — R7401 Elevation of levels of liver transaminase levels: Secondary | ICD-10-CM | POA: Diagnosis not present

## 2021-09-11 DIAGNOSIS — R5383 Other fatigue: Secondary | ICD-10-CM | POA: Diagnosis not present

## 2021-09-12 DIAGNOSIS — F4310 Post-traumatic stress disorder, unspecified: Secondary | ICD-10-CM | POA: Diagnosis not present

## 2021-09-12 DIAGNOSIS — F411 Generalized anxiety disorder: Secondary | ICD-10-CM | POA: Diagnosis not present

## 2021-09-12 DIAGNOSIS — F331 Major depressive disorder, recurrent, moderate: Secondary | ICD-10-CM | POA: Diagnosis not present

## 2021-09-12 DIAGNOSIS — F900 Attention-deficit hyperactivity disorder, predominantly inattentive type: Secondary | ICD-10-CM | POA: Diagnosis not present

## 2021-09-14 DIAGNOSIS — I517 Cardiomegaly: Secondary | ICD-10-CM | POA: Diagnosis not present

## 2021-09-14 DIAGNOSIS — I5181 Takotsubo syndrome: Secondary | ICD-10-CM | POA: Diagnosis not present

## 2021-09-14 DIAGNOSIS — Z0189 Encounter for other specified special examinations: Secondary | ICD-10-CM | POA: Diagnosis not present

## 2021-09-17 ENCOUNTER — Other Ambulatory Visit (INDEPENDENT_AMBULATORY_CARE_PROVIDER_SITE_OTHER): Payer: Self-pay | Admitting: Primary Care

## 2021-09-17 DIAGNOSIS — I10 Essential (primary) hypertension: Secondary | ICD-10-CM

## 2021-09-18 NOTE — Telephone Encounter (Signed)
Requesting too soon, filled 08/10/21 for 90 day supply. Requested Prescriptions  Pending Prescriptions Disp Refills   NIFEdipine (PROCARDIA-XL/NIFEDICAL-XL) 30 MG 24 hr tablet [Pharmacy Med Name: NIFEDIPINE ER 30 MG TABLET] 90 tablet 0    Sig: TAKE 1 TABLET BY MOUTH EVERY DAY     Cardiovascular: Calcium Channel Blockers 2 Passed - 09/17/2021 11:58 AM      Passed - Last BP in normal range    BP Readings from Last 1 Encounters:  05/30/21 124/78          Passed - Last Heart Rate in normal range    Pulse Readings from Last 1 Encounters:  05/29/21 97          Passed - Valid encounter within last 6 months    Recent Outpatient Visits           1 month ago Essential hypertension   [facility] [name], NP   3 months ago Essential hypertension   [facility] [name], NP   4 months ago Essential hypertension   [facility] [name], NP   1 year ago Essential hypertension   [facility] [name], NP

## 2021-09-18 NOTE — Telephone Encounter (Signed)
Requesting too soon. Requested Prescriptions  Pending Prescriptions Disp Refills   NIFEdipine (PROCARDIA-XL/NIFEDICAL-XL) 30 MG 24 hr tablet [Pharmacy Med Name: NIFEDIPINE ER 30 MG TABLET] 90 tablet 0    Sig: TAKE 1 TABLET BY MOUTH EVERY DAY     Cardiovascular: Calcium Channel Blockers 2 Passed - 09/17/2021 11:58 AM      Passed - Last BP in normal range    BP Readings from Last 1 Encounters:  05/30/21 124/78         Passed - Last Heart Rate in normal range    Pulse Readings from Last 1 Encounters:  05/29/21 97         Passed - Valid encounter within last 6 months    Recent Outpatient Visits          1 month ago Essential hypertension   [facility] [name], NP   3 months ago Essential hypertension   [facility] [name], NP   4 months ago Essential hypertension   [facility] [name], NP   1 year ago Essential hypertension   [facility] [name], NP

## 2021-09-19 DIAGNOSIS — Z3A23 23 weeks gestation of pregnancy: Secondary | ICD-10-CM | POA: Diagnosis not present

## 2021-09-19 DIAGNOSIS — O30042 Twin pregnancy, dichorionic/diamniotic, second trimester: Secondary | ICD-10-CM | POA: Diagnosis not present

## 2021-09-19 DIAGNOSIS — O0992 Supervision of high risk pregnancy, unspecified, second trimester: Secondary | ICD-10-CM | POA: Diagnosis not present

## 2021-09-19 DIAGNOSIS — Z6841 Body Mass Index (BMI) 40.0 and over, adult: Secondary | ICD-10-CM | POA: Diagnosis not present

## 2021-09-19 DIAGNOSIS — O09812 Supervision of pregnancy resulting from assisted reproductive technology, second trimester: Secondary | ICD-10-CM | POA: Diagnosis not present

## 2021-09-19 DIAGNOSIS — O99210 Obesity complicating pregnancy, unspecified trimester: Secondary | ICD-10-CM | POA: Diagnosis not present

## 2021-09-19 DIAGNOSIS — O09891 Supervision of other high risk pregnancies, first trimester: Secondary | ICD-10-CM | POA: Diagnosis not present

## 2021-09-19 DIAGNOSIS — O10919 Unspecified pre-existing hypertension complicating pregnancy, unspecified trimester: Secondary | ICD-10-CM | POA: Diagnosis not present

## 2021-09-19 DIAGNOSIS — O30041 Twin pregnancy, dichorionic/diamniotic, first trimester: Secondary | ICD-10-CM | POA: Diagnosis not present

## 2021-09-19 DIAGNOSIS — O09811 Supervision of pregnancy resulting from assisted reproductive technology, first trimester: Secondary | ICD-10-CM | POA: Diagnosis not present

## 2021-09-19 DIAGNOSIS — Z3A18 18 weeks gestation of pregnancy: Secondary | ICD-10-CM | POA: Diagnosis not present

## 2021-09-26 DIAGNOSIS — F900 Attention-deficit hyperactivity disorder, predominantly inattentive type: Secondary | ICD-10-CM | POA: Diagnosis not present

## 2021-09-26 DIAGNOSIS — F4310 Post-traumatic stress disorder, unspecified: Secondary | ICD-10-CM | POA: Diagnosis not present

## 2021-09-26 DIAGNOSIS — F331 Major depressive disorder, recurrent, moderate: Secondary | ICD-10-CM | POA: Diagnosis not present

## 2021-09-26 DIAGNOSIS — F411 Generalized anxiety disorder: Secondary | ICD-10-CM | POA: Diagnosis not present

## 2021-09-28 DIAGNOSIS — O09891 Supervision of other high risk pregnancies, first trimester: Secondary | ICD-10-CM | POA: Diagnosis not present

## 2021-09-28 DIAGNOSIS — O10012 Pre-existing essential hypertension complicating pregnancy, second trimester: Secondary | ICD-10-CM | POA: Diagnosis not present

## 2021-09-28 DIAGNOSIS — E669 Obesity, unspecified: Secondary | ICD-10-CM | POA: Diagnosis not present

## 2021-09-28 DIAGNOSIS — O99891 Other specified diseases and conditions complicating pregnancy: Secondary | ICD-10-CM | POA: Diagnosis not present

## 2021-09-28 DIAGNOSIS — J4520 Mild intermittent asthma, uncomplicated: Secondary | ICD-10-CM | POA: Diagnosis not present

## 2021-09-28 DIAGNOSIS — O30041 Twin pregnancy, dichorionic/diamniotic, first trimester: Secondary | ICD-10-CM | POA: Diagnosis not present

## 2021-09-28 DIAGNOSIS — O99213 Obesity complicating pregnancy, third trimester: Secondary | ICD-10-CM | POA: Diagnosis not present

## 2021-09-28 DIAGNOSIS — R7303 Prediabetes: Secondary | ICD-10-CM | POA: Diagnosis not present

## 2021-09-28 DIAGNOSIS — O99210 Obesity complicating pregnancy, unspecified trimester: Secondary | ICD-10-CM | POA: Diagnosis not present

## 2021-09-28 DIAGNOSIS — O30042 Twin pregnancy, dichorionic/diamniotic, second trimester: Secondary | ICD-10-CM | POA: Diagnosis not present

## 2021-09-28 DIAGNOSIS — O09812 Supervision of pregnancy resulting from assisted reproductive technology, second trimester: Secondary | ICD-10-CM | POA: Diagnosis not present

## 2021-09-28 DIAGNOSIS — F419 Anxiety disorder, unspecified: Secondary | ICD-10-CM | POA: Diagnosis not present

## 2021-09-28 DIAGNOSIS — Z3689 Encounter for other specified antenatal screening: Secondary | ICD-10-CM | POA: Diagnosis not present

## 2021-09-28 DIAGNOSIS — R000 Tachycardia, unspecified: Secondary | ICD-10-CM | POA: Diagnosis not present

## 2021-09-28 DIAGNOSIS — O10919 Unspecified pre-existing hypertension complicating pregnancy, unspecified trimester: Secondary | ICD-10-CM | POA: Diagnosis not present

## 2021-09-28 DIAGNOSIS — Z789 Other specified health status: Secondary | ICD-10-CM | POA: Diagnosis not present

## 2021-09-28 DIAGNOSIS — I10 Essential (primary) hypertension: Secondary | ICD-10-CM | POA: Diagnosis not present

## 2021-09-28 DIAGNOSIS — I517 Cardiomegaly: Secondary | ICD-10-CM | POA: Diagnosis not present

## 2021-10-03 DIAGNOSIS — F4310 Post-traumatic stress disorder, unspecified: Secondary | ICD-10-CM | POA: Diagnosis not present

## 2021-10-03 DIAGNOSIS — G4700 Insomnia, unspecified: Secondary | ICD-10-CM | POA: Diagnosis not present

## 2021-10-03 DIAGNOSIS — F331 Major depressive disorder, recurrent, moderate: Secondary | ICD-10-CM | POA: Diagnosis not present

## 2021-10-03 DIAGNOSIS — F411 Generalized anxiety disorder: Secondary | ICD-10-CM | POA: Diagnosis not present

## 2021-10-10 DIAGNOSIS — G4700 Insomnia, unspecified: Secondary | ICD-10-CM | POA: Diagnosis not present

## 2021-10-10 DIAGNOSIS — F411 Generalized anxiety disorder: Secondary | ICD-10-CM | POA: Diagnosis not present

## 2021-10-10 DIAGNOSIS — F4310 Post-traumatic stress disorder, unspecified: Secondary | ICD-10-CM | POA: Diagnosis not present

## 2021-10-10 DIAGNOSIS — F331 Major depressive disorder, recurrent, moderate: Secondary | ICD-10-CM | POA: Diagnosis not present

## 2021-10-17 DIAGNOSIS — F331 Major depressive disorder, recurrent, moderate: Secondary | ICD-10-CM | POA: Diagnosis not present

## 2021-10-17 DIAGNOSIS — F411 Generalized anxiety disorder: Secondary | ICD-10-CM | POA: Diagnosis not present

## 2021-10-17 DIAGNOSIS — G4700 Insomnia, unspecified: Secondary | ICD-10-CM | POA: Diagnosis not present

## 2021-10-17 DIAGNOSIS — F4310 Post-traumatic stress disorder, unspecified: Secondary | ICD-10-CM | POA: Diagnosis not present

## 2021-10-23 DIAGNOSIS — Z3689 Encounter for other specified antenatal screening: Secondary | ICD-10-CM | POA: Diagnosis not present

## 2021-10-23 DIAGNOSIS — R7303 Prediabetes: Secondary | ICD-10-CM | POA: Diagnosis not present

## 2021-10-23 DIAGNOSIS — I517 Cardiomegaly: Secondary | ICD-10-CM | POA: Diagnosis not present

## 2021-10-23 DIAGNOSIS — O0993 Supervision of high risk pregnancy, unspecified, third trimester: Secondary | ICD-10-CM | POA: Diagnosis not present

## 2021-10-23 DIAGNOSIS — O09811 Supervision of pregnancy resulting from assisted reproductive technology, first trimester: Secondary | ICD-10-CM | POA: Diagnosis not present

## 2021-10-23 DIAGNOSIS — O99210 Obesity complicating pregnancy, unspecified trimester: Secondary | ICD-10-CM | POA: Diagnosis not present

## 2021-10-23 DIAGNOSIS — Z23 Encounter for immunization: Secondary | ICD-10-CM | POA: Diagnosis not present

## 2021-10-23 DIAGNOSIS — F419 Anxiety disorder, unspecified: Secondary | ICD-10-CM | POA: Diagnosis not present

## 2021-10-23 DIAGNOSIS — I10 Essential (primary) hypertension: Secondary | ICD-10-CM | POA: Diagnosis not present

## 2021-10-23 DIAGNOSIS — O0992 Supervision of high risk pregnancy, unspecified, second trimester: Secondary | ICD-10-CM | POA: Diagnosis not present

## 2021-10-23 DIAGNOSIS — D500 Iron deficiency anemia secondary to blood loss (chronic): Secondary | ICD-10-CM | POA: Diagnosis not present

## 2021-10-23 DIAGNOSIS — O30042 Twin pregnancy, dichorionic/diamniotic, second trimester: Secondary | ICD-10-CM | POA: Diagnosis not present

## 2021-10-23 DIAGNOSIS — Z789 Other specified health status: Secondary | ICD-10-CM | POA: Diagnosis not present

## 2021-10-23 DIAGNOSIS — O30043 Twin pregnancy, dichorionic/diamniotic, third trimester: Secondary | ICD-10-CM | POA: Diagnosis not present

## 2021-10-23 DIAGNOSIS — O09891 Supervision of other high risk pregnancies, first trimester: Secondary | ICD-10-CM | POA: Diagnosis not present

## 2021-10-23 DIAGNOSIS — O30041 Twin pregnancy, dichorionic/diamniotic, first trimester: Secondary | ICD-10-CM | POA: Diagnosis not present

## 2021-10-23 DIAGNOSIS — O09813 Supervision of pregnancy resulting from assisted reproductive technology, third trimester: Secondary | ICD-10-CM | POA: Diagnosis not present

## 2021-10-23 DIAGNOSIS — O10919 Unspecified pre-existing hypertension complicating pregnancy, unspecified trimester: Secondary | ICD-10-CM | POA: Diagnosis not present

## 2021-10-24 DIAGNOSIS — G4700 Insomnia, unspecified: Secondary | ICD-10-CM | POA: Diagnosis not present

## 2021-10-24 DIAGNOSIS — F411 Generalized anxiety disorder: Secondary | ICD-10-CM | POA: Diagnosis not present

## 2021-10-24 DIAGNOSIS — F900 Attention-deficit hyperactivity disorder, predominantly inattentive type: Secondary | ICD-10-CM | POA: Diagnosis not present

## 2021-10-24 DIAGNOSIS — F331 Major depressive disorder, recurrent, moderate: Secondary | ICD-10-CM | POA: Diagnosis not present

## 2021-10-31 DIAGNOSIS — F411 Generalized anxiety disorder: Secondary | ICD-10-CM | POA: Diagnosis not present

## 2021-10-31 DIAGNOSIS — F4310 Post-traumatic stress disorder, unspecified: Secondary | ICD-10-CM | POA: Diagnosis not present

## 2021-10-31 DIAGNOSIS — F331 Major depressive disorder, recurrent, moderate: Secondary | ICD-10-CM | POA: Diagnosis not present

## 2021-10-31 DIAGNOSIS — F900 Attention-deficit hyperactivity disorder, predominantly inattentive type: Secondary | ICD-10-CM | POA: Diagnosis not present

## 2021-11-02 DIAGNOSIS — M5489 Other dorsalgia: Secondary | ICD-10-CM | POA: Diagnosis not present

## 2021-11-07 DIAGNOSIS — F4310 Post-traumatic stress disorder, unspecified: Secondary | ICD-10-CM | POA: Diagnosis not present

## 2021-11-07 DIAGNOSIS — F331 Major depressive disorder, recurrent, moderate: Secondary | ICD-10-CM | POA: Diagnosis not present

## 2021-11-07 DIAGNOSIS — F411 Generalized anxiety disorder: Secondary | ICD-10-CM | POA: Diagnosis not present

## 2021-11-07 DIAGNOSIS — G4700 Insomnia, unspecified: Secondary | ICD-10-CM | POA: Diagnosis not present

## 2021-11-14 DIAGNOSIS — F4310 Post-traumatic stress disorder, unspecified: Secondary | ICD-10-CM | POA: Diagnosis not present

## 2021-11-14 DIAGNOSIS — F411 Generalized anxiety disorder: Secondary | ICD-10-CM | POA: Diagnosis not present

## 2021-11-14 DIAGNOSIS — F331 Major depressive disorder, recurrent, moderate: Secondary | ICD-10-CM | POA: Diagnosis not present

## 2021-11-14 DIAGNOSIS — G4700 Insomnia, unspecified: Secondary | ICD-10-CM | POA: Diagnosis not present

## 2021-11-20 DIAGNOSIS — Z6841 Body Mass Index (BMI) 40.0 and over, adult: Secondary | ICD-10-CM | POA: Diagnosis not present

## 2021-11-20 DIAGNOSIS — O30042 Twin pregnancy, dichorionic/diamniotic, second trimester: Secondary | ICD-10-CM | POA: Diagnosis not present

## 2021-11-20 DIAGNOSIS — O0993 Supervision of high risk pregnancy, unspecified, third trimester: Secondary | ICD-10-CM | POA: Diagnosis not present

## 2021-11-20 DIAGNOSIS — R0989 Other specified symptoms and signs involving the circulatory and respiratory systems: Secondary | ICD-10-CM | POA: Diagnosis not present

## 2021-11-20 DIAGNOSIS — O365991 Maternal care for other known or suspected poor fetal growth, unspecified trimester, fetus 1: Secondary | ICD-10-CM | POA: Diagnosis not present

## 2021-11-20 DIAGNOSIS — Z3689 Encounter for other specified antenatal screening: Secondary | ICD-10-CM | POA: Diagnosis not present

## 2021-11-20 DIAGNOSIS — O30043 Twin pregnancy, dichorionic/diamniotic, third trimester: Secondary | ICD-10-CM | POA: Diagnosis not present

## 2021-11-20 DIAGNOSIS — I10 Essential (primary) hypertension: Secondary | ICD-10-CM | POA: Diagnosis not present

## 2021-11-20 DIAGNOSIS — O99413 Diseases of the circulatory system complicating pregnancy, third trimester: Secondary | ICD-10-CM | POA: Diagnosis not present

## 2021-11-20 DIAGNOSIS — I517 Cardiomegaly: Secondary | ICD-10-CM | POA: Diagnosis not present

## 2021-11-20 DIAGNOSIS — O99810 Abnormal glucose complicating pregnancy: Secondary | ICD-10-CM | POA: Diagnosis not present

## 2021-11-20 DIAGNOSIS — O10013 Pre-existing essential hypertension complicating pregnancy, third trimester: Secondary | ICD-10-CM | POA: Diagnosis not present

## 2021-11-20 DIAGNOSIS — Z3A32 32 weeks gestation of pregnancy: Secondary | ICD-10-CM | POA: Diagnosis not present

## 2021-11-20 DIAGNOSIS — O99210 Obesity complicating pregnancy, unspecified trimester: Secondary | ICD-10-CM | POA: Diagnosis not present

## 2021-11-21 DIAGNOSIS — F411 Generalized anxiety disorder: Secondary | ICD-10-CM | POA: Diagnosis not present

## 2021-11-21 DIAGNOSIS — F900 Attention-deficit hyperactivity disorder, predominantly inattentive type: Secondary | ICD-10-CM | POA: Diagnosis not present

## 2021-11-21 DIAGNOSIS — F331 Major depressive disorder, recurrent, moderate: Secondary | ICD-10-CM | POA: Diagnosis not present

## 2021-11-22 DIAGNOSIS — O99019 Anemia complicating pregnancy, unspecified trimester: Secondary | ICD-10-CM | POA: Diagnosis not present

## 2021-11-22 DIAGNOSIS — Z531 Procedure and treatment not carried out because of patient's decision for reasons of belief and group pressure: Secondary | ICD-10-CM | POA: Diagnosis not present

## 2021-11-22 DIAGNOSIS — D509 Iron deficiency anemia, unspecified: Secondary | ICD-10-CM | POA: Diagnosis not present

## 2021-11-23 DIAGNOSIS — O365931 Maternal care for other known or suspected poor fetal growth, third trimester, fetus 1: Secondary | ICD-10-CM | POA: Diagnosis not present

## 2021-11-23 DIAGNOSIS — O99210 Obesity complicating pregnancy, unspecified trimester: Secondary | ICD-10-CM | POA: Diagnosis not present

## 2021-11-23 DIAGNOSIS — O09813 Supervision of pregnancy resulting from assisted reproductive technology, third trimester: Secondary | ICD-10-CM | POA: Diagnosis not present

## 2021-11-23 DIAGNOSIS — O10013 Pre-existing essential hypertension complicating pregnancy, third trimester: Secondary | ICD-10-CM | POA: Diagnosis not present

## 2021-11-23 DIAGNOSIS — Z3A33 33 weeks gestation of pregnancy: Secondary | ICD-10-CM | POA: Diagnosis not present

## 2021-11-23 DIAGNOSIS — Z6841 Body Mass Index (BMI) 40.0 and over, adult: Secondary | ICD-10-CM | POA: Diagnosis not present

## 2021-11-23 DIAGNOSIS — O365991 Maternal care for other known or suspected poor fetal growth, unspecified trimester, fetus 1: Secondary | ICD-10-CM | POA: Diagnosis not present

## 2021-11-23 DIAGNOSIS — O30042 Twin pregnancy, dichorionic/diamniotic, second trimester: Secondary | ICD-10-CM | POA: Diagnosis not present

## 2021-11-23 DIAGNOSIS — I10 Essential (primary) hypertension: Secondary | ICD-10-CM | POA: Diagnosis not present

## 2021-11-23 DIAGNOSIS — I517 Cardiomegaly: Secondary | ICD-10-CM | POA: Diagnosis not present

## 2021-11-27 DIAGNOSIS — O99213 Obesity complicating pregnancy, third trimester: Secondary | ICD-10-CM | POA: Diagnosis not present

## 2021-11-27 DIAGNOSIS — O99210 Obesity complicating pregnancy, unspecified trimester: Secondary | ICD-10-CM | POA: Diagnosis not present

## 2021-11-27 DIAGNOSIS — R000 Tachycardia, unspecified: Secondary | ICD-10-CM | POA: Diagnosis not present

## 2021-11-27 DIAGNOSIS — Z3A33 33 weeks gestation of pregnancy: Secondary | ICD-10-CM | POA: Diagnosis not present

## 2021-11-27 DIAGNOSIS — O99413 Diseases of the circulatory system complicating pregnancy, third trimester: Secondary | ICD-10-CM | POA: Diagnosis not present

## 2021-11-27 DIAGNOSIS — E669 Obesity, unspecified: Secondary | ICD-10-CM | POA: Diagnosis not present

## 2021-11-27 DIAGNOSIS — I10 Essential (primary) hypertension: Secondary | ICD-10-CM | POA: Diagnosis not present

## 2021-11-27 DIAGNOSIS — Z3689 Encounter for other specified antenatal screening: Secondary | ICD-10-CM | POA: Diagnosis not present

## 2021-11-27 DIAGNOSIS — O30043 Twin pregnancy, dichorionic/diamniotic, third trimester: Secondary | ICD-10-CM | POA: Diagnosis not present

## 2021-11-27 DIAGNOSIS — O365991 Maternal care for other known or suspected poor fetal growth, unspecified trimester, fetus 1: Secondary | ICD-10-CM | POA: Diagnosis not present

## 2021-11-27 DIAGNOSIS — O30042 Twin pregnancy, dichorionic/diamniotic, second trimester: Secondary | ICD-10-CM | POA: Diagnosis not present

## 2021-11-27 DIAGNOSIS — O09813 Supervision of pregnancy resulting from assisted reproductive technology, third trimester: Secondary | ICD-10-CM | POA: Diagnosis not present

## 2021-11-27 DIAGNOSIS — I517 Cardiomegaly: Secondary | ICD-10-CM | POA: Diagnosis not present

## 2021-11-27 DIAGNOSIS — O99810 Abnormal glucose complicating pregnancy: Secondary | ICD-10-CM | POA: Diagnosis not present

## 2021-11-27 DIAGNOSIS — O365931 Maternal care for other known or suspected poor fetal growth, third trimester, fetus 1: Secondary | ICD-10-CM | POA: Diagnosis not present

## 2021-11-27 DIAGNOSIS — O10013 Pre-existing essential hypertension complicating pregnancy, third trimester: Secondary | ICD-10-CM | POA: Diagnosis not present

## 2021-11-27 DIAGNOSIS — R0989 Other specified symptoms and signs involving the circulatory and respiratory systems: Secondary | ICD-10-CM | POA: Diagnosis not present

## 2021-11-28 DIAGNOSIS — F900 Attention-deficit hyperactivity disorder, predominantly inattentive type: Secondary | ICD-10-CM | POA: Diagnosis not present

## 2021-11-28 DIAGNOSIS — F331 Major depressive disorder, recurrent, moderate: Secondary | ICD-10-CM | POA: Diagnosis not present

## 2021-11-28 DIAGNOSIS — F411 Generalized anxiety disorder: Secondary | ICD-10-CM | POA: Diagnosis not present

## 2021-11-28 DIAGNOSIS — F4310 Post-traumatic stress disorder, unspecified: Secondary | ICD-10-CM | POA: Diagnosis not present
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
## 2021-11-30 DIAGNOSIS — Z531 Procedure and treatment not carried out because of patient's decision for reasons of belief and group pressure: Secondary | ICD-10-CM | POA: Diagnosis not present

## 2021-11-30 DIAGNOSIS — D509 Iron deficiency anemia, unspecified: Secondary | ICD-10-CM | POA: Diagnosis not present

## 2021-11-30 DIAGNOSIS — O99019 Anemia complicating pregnancy, unspecified trimester: Secondary | ICD-10-CM | POA: Diagnosis not present

## 2021-12-04 DIAGNOSIS — I10 Essential (primary) hypertension: Secondary | ICD-10-CM | POA: Diagnosis not present

## 2021-12-04 DIAGNOSIS — O99210 Obesity complicating pregnancy, unspecified trimester: Secondary | ICD-10-CM | POA: Diagnosis not present

## 2021-12-04 DIAGNOSIS — O0993 Supervision of high risk pregnancy, unspecified, third trimester: Secondary | ICD-10-CM | POA: Diagnosis not present

## 2021-12-04 DIAGNOSIS — Z3A34 34 weeks gestation of pregnancy: Secondary | ICD-10-CM | POA: Diagnosis not present

## 2021-12-04 DIAGNOSIS — O10013 Pre-existing essential hypertension complicating pregnancy, third trimester: Secondary | ICD-10-CM | POA: Diagnosis not present

## 2021-12-04 DIAGNOSIS — O365931 Maternal care for other known or suspected poor fetal growth, third trimester, fetus 1: Secondary | ICD-10-CM | POA: Diagnosis not present

## 2021-12-04 DIAGNOSIS — J4520 Mild intermittent asthma, uncomplicated: Secondary | ICD-10-CM | POA: Diagnosis not present

## 2021-12-04 DIAGNOSIS — O30043 Twin pregnancy, dichorionic/diamniotic, third trimester: Secondary | ICD-10-CM | POA: Diagnosis not present

## 2021-12-04 DIAGNOSIS — O365991 Maternal care for other known or suspected poor fetal growth, unspecified trimester, fetus 1: Secondary | ICD-10-CM | POA: Diagnosis not present

## 2021-12-04 DIAGNOSIS — O99413 Diseases of the circulatory system complicating pregnancy, third trimester: Secondary | ICD-10-CM | POA: Diagnosis not present

## 2021-12-04 DIAGNOSIS — Z3689 Encounter for other specified antenatal screening: Secondary | ICD-10-CM | POA: Diagnosis not present

## 2021-12-04 DIAGNOSIS — R000 Tachycardia, unspecified: Secondary | ICD-10-CM | POA: Diagnosis not present

## 2021-12-04 DIAGNOSIS — O30042 Twin pregnancy, dichorionic/diamniotic, second trimester: Secondary | ICD-10-CM | POA: Diagnosis not present

## 2021-12-04 DIAGNOSIS — R0989 Other specified symptoms and signs involving the circulatory and respiratory systems: Secondary | ICD-10-CM | POA: Diagnosis not present

## 2021-12-04 DIAGNOSIS — Z364 Encounter for antenatal screening for fetal growth retardation: Secondary | ICD-10-CM | POA: Diagnosis not present

## 2021-12-04 DIAGNOSIS — R7303 Prediabetes: Secondary | ICD-10-CM | POA: Diagnosis not present

## 2021-12-04 DIAGNOSIS — Z789 Other specified health status: Secondary | ICD-10-CM | POA: Diagnosis not present

## 2021-12-04 DIAGNOSIS — F419 Anxiety disorder, unspecified: Secondary | ICD-10-CM | POA: Diagnosis not present

## 2021-12-04 DIAGNOSIS — I517 Cardiomegaly: Secondary | ICD-10-CM | POA: Diagnosis not present

## 2021-12-05 DIAGNOSIS — F331 Major depressive disorder, recurrent, moderate: Secondary | ICD-10-CM | POA: Diagnosis not present

## 2021-12-05 DIAGNOSIS — F411 Generalized anxiety disorder: Secondary | ICD-10-CM | POA: Diagnosis not present

## 2021-12-06 DIAGNOSIS — O99513 Diseases of the respiratory system complicating pregnancy, third trimester: Secondary | ICD-10-CM | POA: Diagnosis not present

## 2021-12-06 DIAGNOSIS — Z3A35 35 weeks gestation of pregnancy: Secondary | ICD-10-CM | POA: Diagnosis not present

## 2021-12-06 DIAGNOSIS — R0989 Other specified symptoms and signs involving the circulatory and respiratory systems: Secondary | ICD-10-CM | POA: Diagnosis not present

## 2021-12-06 DIAGNOSIS — D509 Iron deficiency anemia, unspecified: Secondary | ICD-10-CM | POA: Diagnosis not present

## 2021-12-06 DIAGNOSIS — O99013 Anemia complicating pregnancy, third trimester: Secondary | ICD-10-CM | POA: Diagnosis not present

## 2021-12-06 DIAGNOSIS — Z531 Procedure and treatment not carried out because of patient's decision for reasons of belief and group pressure: Secondary | ICD-10-CM | POA: Diagnosis not present

## 2021-12-07 DIAGNOSIS — I517 Cardiomegaly: Secondary | ICD-10-CM | POA: Diagnosis not present

## 2021-12-07 DIAGNOSIS — O99210 Obesity complicating pregnancy, unspecified trimester: Secondary | ICD-10-CM | POA: Diagnosis not present

## 2021-12-07 DIAGNOSIS — I10 Essential (primary) hypertension: Secondary | ICD-10-CM | POA: Diagnosis not present

## 2021-12-07 DIAGNOSIS — Z3A35 35 weeks gestation of pregnancy: Secondary | ICD-10-CM | POA: Diagnosis not present

## 2021-12-07 DIAGNOSIS — O30042 Twin pregnancy, dichorionic/diamniotic, second trimester: Secondary | ICD-10-CM | POA: Diagnosis not present

## 2021-12-07 DIAGNOSIS — Z6841 Body Mass Index (BMI) 40.0 and over, adult: Secondary | ICD-10-CM | POA: Diagnosis not present

## 2021-12-07 DIAGNOSIS — O365931 Maternal care for other known or suspected poor fetal growth, third trimester, fetus 1: Secondary | ICD-10-CM | POA: Diagnosis not present

## 2021-12-07 DIAGNOSIS — O30043 Twin pregnancy, dichorionic/diamniotic, third trimester: Secondary | ICD-10-CM | POA: Diagnosis not present

## 2021-12-10 ENCOUNTER — Other Ambulatory Visit (INDEPENDENT_AMBULATORY_CARE_PROVIDER_SITE_OTHER): Payer: Self-pay | Admitting: Primary Care

## 2021-12-10 DIAGNOSIS — I10 Essential (primary) hypertension: Secondary | ICD-10-CM

## 2021-12-11 ENCOUNTER — Encounter (INDEPENDENT_AMBULATORY_CARE_PROVIDER_SITE_OTHER): Payer: Self-pay | Admitting: Primary Care

## 2021-12-11 ENCOUNTER — Other Ambulatory Visit (INDEPENDENT_AMBULATORY_CARE_PROVIDER_SITE_OTHER): Payer: Self-pay | Admitting: Primary Care

## 2021-12-11 DIAGNOSIS — I10 Essential (primary) hypertension: Secondary | ICD-10-CM

## 2021-12-11 HISTORY — DX: Essential (primary) hypertension: I10

## 2021-12-11 MED ORDER — NIFEDIPINE ER OSMOTIC RELEASE 30 MG PO TB24: 30.0000 mg | ORAL_TABLET | Freq: Every day | ORAL | 0 refills | Status: DC

## 2021-12-11 NOTE — Telephone Encounter (Signed)
Note says Dx code needed.  ?

## 2021-12-12 DIAGNOSIS — O30043 Twin pregnancy, dichorionic/diamniotic, third trimester: Secondary | ICD-10-CM | POA: Diagnosis not present

## 2021-12-12 DIAGNOSIS — O365991 Maternal care for other known or suspected poor fetal growth, unspecified trimester, fetus 1: Secondary | ICD-10-CM | POA: Diagnosis not present

## 2021-12-12 DIAGNOSIS — O10013 Pre-existing essential hypertension complicating pregnancy, third trimester: Secondary | ICD-10-CM | POA: Diagnosis not present

## 2021-12-12 DIAGNOSIS — D509 Iron deficiency anemia, unspecified: Secondary | ICD-10-CM | POA: Diagnosis not present

## 2021-12-12 DIAGNOSIS — O99213 Obesity complicating pregnancy, third trimester: Secondary | ICD-10-CM | POA: Diagnosis not present

## 2021-12-12 DIAGNOSIS — I10 Essential (primary) hypertension: Secondary | ICD-10-CM | POA: Diagnosis not present

## 2021-12-12 DIAGNOSIS — Z6841 Body Mass Index (BMI) 40.0 and over, adult: Secondary | ICD-10-CM | POA: Diagnosis not present

## 2021-12-12 DIAGNOSIS — O0993 Supervision of high risk pregnancy, unspecified, third trimester: Secondary | ICD-10-CM | POA: Diagnosis not present

## 2021-12-12 DIAGNOSIS — O10919 Unspecified pre-existing hypertension complicating pregnancy, unspecified trimester: Secondary | ICD-10-CM | POA: Diagnosis not present

## 2021-12-12 DIAGNOSIS — O365931 Maternal care for other known or suspected poor fetal growth, third trimester, fetus 1: Secondary | ICD-10-CM | POA: Diagnosis not present

## 2021-12-12 DIAGNOSIS — O30041 Twin pregnancy, dichorionic/diamniotic, first trimester: Secondary | ICD-10-CM | POA: Diagnosis not present

## 2021-12-12 DIAGNOSIS — Z3A35 35 weeks gestation of pregnancy: Secondary | ICD-10-CM | POA: Diagnosis not present

## 2021-12-12 DIAGNOSIS — O09811 Supervision of pregnancy resulting from assisted reproductive technology, first trimester: Secondary | ICD-10-CM | POA: Diagnosis not present

## 2021-12-12 DIAGNOSIS — Z364 Encounter for antenatal screening for fetal growth retardation: Secondary | ICD-10-CM | POA: Diagnosis not present

## 2021-12-12 DIAGNOSIS — O99210 Obesity complicating pregnancy, unspecified trimester: Secondary | ICD-10-CM | POA: Diagnosis not present

## 2021-12-12 DIAGNOSIS — R609 Edema, unspecified: Secondary | ICD-10-CM | POA: Diagnosis not present

## 2021-12-12 DIAGNOSIS — Z3689 Encounter for other specified antenatal screening: Secondary | ICD-10-CM | POA: Diagnosis not present

## 2021-12-12 DIAGNOSIS — O30042 Twin pregnancy, dichorionic/diamniotic, second trimester: Secondary | ICD-10-CM | POA: Diagnosis not present

## 2021-12-12 DIAGNOSIS — O99810 Abnormal glucose complicating pregnancy: Secondary | ICD-10-CM | POA: Diagnosis not present

## 2021-12-12 DIAGNOSIS — I517 Cardiomegaly: Secondary | ICD-10-CM | POA: Diagnosis not present

## 2021-12-12 DIAGNOSIS — R7303 Prediabetes: Secondary | ICD-10-CM | POA: Diagnosis not present

## 2021-12-15 DIAGNOSIS — Z364 Encounter for antenatal screening for fetal growth retardation: Secondary | ICD-10-CM | POA: Diagnosis not present

## 2021-12-15 DIAGNOSIS — O99344 Other mental disorders complicating childbirth: Secondary | ICD-10-CM | POA: Diagnosis not present

## 2021-12-15 DIAGNOSIS — O9902 Anemia complicating childbirth: Secondary | ICD-10-CM | POA: Diagnosis not present

## 2021-12-15 DIAGNOSIS — O1092 Unspecified pre-existing hypertension complicating childbirth: Secondary | ICD-10-CM | POA: Diagnosis not present

## 2021-12-15 DIAGNOSIS — O365931 Maternal care for other known or suspected poor fetal growth, third trimester, fetus 1: Secondary | ICD-10-CM | POA: Diagnosis not present

## 2021-12-15 DIAGNOSIS — O99892 Other specified diseases and conditions complicating childbirth: Secondary | ICD-10-CM | POA: Diagnosis not present

## 2021-12-15 DIAGNOSIS — I517 Cardiomegaly: Secondary | ICD-10-CM | POA: Diagnosis not present

## 2021-12-15 DIAGNOSIS — J4520 Mild intermittent asthma, uncomplicated: Secondary | ICD-10-CM | POA: Diagnosis not present

## 2021-12-15 DIAGNOSIS — D509 Iron deficiency anemia, unspecified: Secondary | ICD-10-CM | POA: Diagnosis not present

## 2021-12-15 DIAGNOSIS — F331 Major depressive disorder, recurrent, moderate: Secondary | ICD-10-CM | POA: Diagnosis not present

## 2021-12-15 DIAGNOSIS — O10013 Pre-existing essential hypertension complicating pregnancy, third trimester: Secondary | ICD-10-CM | POA: Diagnosis not present

## 2021-12-15 DIAGNOSIS — Z3A36 36 weeks gestation of pregnancy: Secondary | ICD-10-CM | POA: Diagnosis not present

## 2021-12-15 DIAGNOSIS — O9952 Diseases of the respiratory system complicating childbirth: Secondary | ICD-10-CM | POA: Diagnosis not present

## 2021-12-15 DIAGNOSIS — O30043 Twin pregnancy, dichorionic/diamniotic, third trimester: Secondary | ICD-10-CM | POA: Diagnosis not present

## 2021-12-15 DIAGNOSIS — R000 Tachycardia, unspecified: Secondary | ICD-10-CM | POA: Diagnosis not present

## 2021-12-15 DIAGNOSIS — F32A Depression, unspecified: Secondary | ICD-10-CM | POA: Diagnosis not present

## 2021-12-15 DIAGNOSIS — O99413 Diseases of the circulatory system complicating pregnancy, third trimester: Secondary | ICD-10-CM | POA: Diagnosis not present

## 2021-12-15 DIAGNOSIS — F419 Anxiety disorder, unspecified: Secondary | ICD-10-CM | POA: Diagnosis not present

## 2021-12-15 DIAGNOSIS — Z3689 Encounter for other specified antenatal screening: Secondary | ICD-10-CM | POA: Diagnosis not present

## 2021-12-15 DIAGNOSIS — O321XX1 Maternal care for breech presentation, fetus 1: Secondary | ICD-10-CM | POA: Diagnosis not present

## 2021-12-15 DIAGNOSIS — I5020 Unspecified systolic (congestive) heart failure: Secondary | ICD-10-CM | POA: Diagnosis not present

## 2021-12-15 DIAGNOSIS — O30042 Twin pregnancy, dichorionic/diamniotic, second trimester: Secondary | ICD-10-CM | POA: Diagnosis not present

## 2021-12-15 DIAGNOSIS — F411 Generalized anxiety disorder: Secondary | ICD-10-CM | POA: Diagnosis not present

## 2021-12-15 DIAGNOSIS — O368332 Maternal care for abnormalities of the fetal heart rate or rhythm, third trimester, fetus 2: Secondary | ICD-10-CM | POA: Diagnosis not present

## 2021-12-15 DIAGNOSIS — O24420 Gestational diabetes mellitus in childbirth, diet controlled: Secondary | ICD-10-CM | POA: Diagnosis not present

## 2021-12-15 DIAGNOSIS — G8918 Other acute postprocedural pain: Secondary | ICD-10-CM | POA: Diagnosis not present

## 2021-12-18 DIAGNOSIS — O99413 Diseases of the circulatory system complicating pregnancy, third trimester: Secondary | ICD-10-CM | POA: Diagnosis not present

## 2021-12-18 DIAGNOSIS — O365931 Maternal care for other known or suspected poor fetal growth, third trimester, fetus 1: Secondary | ICD-10-CM | POA: Diagnosis not present

## 2021-12-18 DIAGNOSIS — O30043 Twin pregnancy, dichorionic/diamniotic, third trimester: Secondary | ICD-10-CM | POA: Diagnosis not present

## 2021-12-18 DIAGNOSIS — Z364 Encounter for antenatal screening for fetal growth retardation: Secondary | ICD-10-CM | POA: Diagnosis not present

## 2021-12-18 DIAGNOSIS — O10013 Pre-existing essential hypertension complicating pregnancy, third trimester: Secondary | ICD-10-CM | POA: Diagnosis not present

## 2021-12-18 DIAGNOSIS — Z3689 Encounter for other specified antenatal screening: Secondary | ICD-10-CM | POA: Diagnosis not present

## 2021-12-26 DIAGNOSIS — F331 Major depressive disorder, recurrent, moderate: Secondary | ICD-10-CM | POA: Diagnosis not present

## 2021-12-26 DIAGNOSIS — F411 Generalized anxiety disorder: Secondary | ICD-10-CM | POA: Diagnosis not present

## 2021-12-26 DIAGNOSIS — F4310 Post-traumatic stress disorder, unspecified: Secondary | ICD-10-CM | POA: Diagnosis not present

## 2021-12-26 DIAGNOSIS — G4700 Insomnia, unspecified: Secondary | ICD-10-CM | POA: Diagnosis not present

## 2022-01-02 DIAGNOSIS — G4700 Insomnia, unspecified: Secondary | ICD-10-CM | POA: Diagnosis not present

## 2022-01-02 DIAGNOSIS — F411 Generalized anxiety disorder: Secondary | ICD-10-CM | POA: Diagnosis not present

## 2022-01-02 DIAGNOSIS — F4310 Post-traumatic stress disorder, unspecified: Secondary | ICD-10-CM | POA: Diagnosis not present

## 2022-01-02 DIAGNOSIS — F331 Major depressive disorder, recurrent, moderate: Secondary | ICD-10-CM | POA: Diagnosis not present

## 2022-01-04 DIAGNOSIS — Z6841 Body Mass Index (BMI) 40.0 and over, adult: Secondary | ICD-10-CM | POA: Diagnosis not present

## 2022-01-04 DIAGNOSIS — I517 Cardiomegaly: Secondary | ICD-10-CM | POA: Diagnosis not present

## 2022-01-04 DIAGNOSIS — I10 Essential (primary) hypertension: Secondary | ICD-10-CM | POA: Diagnosis not present

## 2022-01-09 DIAGNOSIS — F411 Generalized anxiety disorder: Secondary | ICD-10-CM | POA: Diagnosis not present

## 2022-01-09 DIAGNOSIS — F331 Major depressive disorder, recurrent, moderate: Secondary | ICD-10-CM | POA: Diagnosis not present

## 2022-01-11 DIAGNOSIS — Z6841 Body Mass Index (BMI) 40.0 and over, adult: Secondary | ICD-10-CM | POA: Diagnosis not present

## 2022-01-11 DIAGNOSIS — R300 Dysuria: Secondary | ICD-10-CM | POA: Diagnosis not present

## 2022-01-11 DIAGNOSIS — E039 Hypothyroidism, unspecified: Secondary | ICD-10-CM | POA: Diagnosis not present

## 2022-01-11 DIAGNOSIS — I10 Essential (primary) hypertension: Secondary | ICD-10-CM | POA: Diagnosis not present

## 2022-01-11 DIAGNOSIS — R7303 Prediabetes: Secondary | ICD-10-CM | POA: Diagnosis not present

## 2022-01-11 DIAGNOSIS — F32A Depression, unspecified: Secondary | ICD-10-CM | POA: Diagnosis not present

## 2022-01-11 DIAGNOSIS — I517 Cardiomegaly: Secondary | ICD-10-CM | POA: Diagnosis not present

## 2022-01-23 DIAGNOSIS — F411 Generalized anxiety disorder: Secondary | ICD-10-CM | POA: Diagnosis not present

## 2022-01-23 DIAGNOSIS — F331 Major depressive disorder, recurrent, moderate: Secondary | ICD-10-CM | POA: Diagnosis not present

## 2022-01-31 DIAGNOSIS — F411 Generalized anxiety disorder: Secondary | ICD-10-CM | POA: Diagnosis not present

## 2022-02-12 DIAGNOSIS — I517 Cardiomegaly: Secondary | ICD-10-CM | POA: Diagnosis not present

## 2022-02-12 DIAGNOSIS — I10 Essential (primary) hypertension: Secondary | ICD-10-CM | POA: Diagnosis not present

## 2022-02-12 DIAGNOSIS — Z124 Encounter for screening for malignant neoplasm of cervix: Secondary | ICD-10-CM | POA: Diagnosis not present

## 2022-02-12 DIAGNOSIS — R0989 Other specified symptoms and signs involving the circulatory and respiratory systems: Secondary | ICD-10-CM | POA: Diagnosis not present

## 2022-02-13 DIAGNOSIS — F411 Generalized anxiety disorder: Secondary | ICD-10-CM | POA: Diagnosis not present

## 2022-02-13 DIAGNOSIS — F331 Major depressive disorder, recurrent, moderate: Secondary | ICD-10-CM | POA: Diagnosis not present

## 2022-02-26 DIAGNOSIS — R000 Tachycardia, unspecified: Secondary | ICD-10-CM | POA: Diagnosis not present

## 2022-02-26 DIAGNOSIS — I517 Cardiomegaly: Secondary | ICD-10-CM | POA: Diagnosis not present

## 2022-02-26 DIAGNOSIS — I10 Essential (primary) hypertension: Secondary | ICD-10-CM | POA: Diagnosis not present

## 2022-02-27 DIAGNOSIS — F331 Major depressive disorder, recurrent, moderate: Secondary | ICD-10-CM | POA: Diagnosis not present

## 2022-02-27 DIAGNOSIS — F411 Generalized anxiety disorder: Secondary | ICD-10-CM | POA: Diagnosis not present

## 2022-03-06 DIAGNOSIS — F411 Generalized anxiety disorder: Secondary | ICD-10-CM | POA: Diagnosis not present

## 2022-03-13 DIAGNOSIS — F411 Generalized anxiety disorder: Secondary | ICD-10-CM | POA: Diagnosis not present

## 2022-03-19 ENCOUNTER — Other Ambulatory Visit (INDEPENDENT_AMBULATORY_CARE_PROVIDER_SITE_OTHER): Payer: Self-pay | Admitting: Primary Care

## 2022-03-19 DIAGNOSIS — I10 Essential (primary) hypertension: Secondary | ICD-10-CM

## 2022-03-20 DIAGNOSIS — F331 Major depressive disorder, recurrent, moderate: Secondary | ICD-10-CM | POA: Diagnosis not present

## 2022-03-27 DIAGNOSIS — G4700 Insomnia, unspecified: Secondary | ICD-10-CM | POA: Diagnosis not present

## 2022-03-27 DIAGNOSIS — F411 Generalized anxiety disorder: Secondary | ICD-10-CM | POA: Diagnosis not present

## 2022-04-03 DIAGNOSIS — F411 Generalized anxiety disorder: Secondary | ICD-10-CM | POA: Diagnosis not present

## 2022-04-17 DIAGNOSIS — F411 Generalized anxiety disorder: Secondary | ICD-10-CM | POA: Diagnosis not present

## 2022-04-17 DIAGNOSIS — F900 Attention-deficit hyperactivity disorder, predominantly inattentive type: Secondary | ICD-10-CM | POA: Diagnosis not present

## 2022-04-23 ENCOUNTER — Encounter (INDEPENDENT_AMBULATORY_CARE_PROVIDER_SITE_OTHER): Payer: Self-pay | Admitting: Primary Care

## 2022-04-23 ENCOUNTER — Ambulatory Visit (INDEPENDENT_AMBULATORY_CARE_PROVIDER_SITE_OTHER): Payer: BC Managed Care – PPO | Admitting: Primary Care

## 2022-04-23 DIAGNOSIS — I10 Essential (primary) hypertension: Secondary | ICD-10-CM | POA: Diagnosis not present

## 2022-04-23 DIAGNOSIS — M549 Dorsalgia, unspecified: Secondary | ICD-10-CM

## 2022-04-23 NOTE — Patient Instructions (Signed)
Morbid Obesity, Adult Obesity is the condition of having too much total body fat. Being overweight or obese means that your weight is greater than what is considered healthy for your body size. Obesity is determined by a measurement called BMI (body mass index). BMI is an estimate of body fat and is calculated from height and weight. For adults, a BMI of 40 or higher is considered obese. Obesity can lead to other health concerns and major illnesses, including: Stroke. Coronary artery disease (CAD). Type 2 diabetes. Some types of cancer, including cancers of the colon, breast, uterus, and gallbladder. High blood pressure (hypertension). High cholesterol. Gallbladder stones. Obesity can also contribute to: Osteoarthritis. Sleep apnea. Infertility problems. What are the causes? Common causes of this condition include: Eating daily meals that are high in calories, sugar, and fat. Drinking high amounts of sugar-sweetened beverages, such as soft drinks. Being born with genes that may make you more likely to become obese. Having a medical condition that causes obesity, including: Hypothyroidism. Polycystic ovarian syndrome (PCOS). Binge-eating disorder. Cushing syndrome. Taking certain medicines, such as steroids, antidepressants, and seizure medicines. Not being physically active (sedentary lifestyle). Not getting enough sleep. What increases the risk? The following factors may make you more likely to develop this condition: Having a family history of obesity. Living in an area with limited access to: [place], recreation centers, or sidewalks. Healthy food choices, such as grocery stores and farmers' markets. What are the signs or symptoms? The main sign of this condition is having too much body fat. How is this diagnosed? This condition is diagnosed based on: Your BMI. If you are an adult with a BMI of 30 or higher, you are considered obese. Your waist circumference. This measures the  distance around your waistline. Your skinfold thickness. Your health care provider may gently pinch a fold of your skin and measure it. You may have other tests to check for underlying conditions. How is this treated? Treatment for this condition often includes changing your lifestyle. Treatment may include some or all of the following: Dietary changes. This may include developing a healthy meal plan. Regular physical activity. This may include activity that causes your heart to beat faster (aerobic exercise) and strength training. Work with your health care provider to design an exercise program that works for you. Medicine to help you lose weight if you are unable to lose one pound a week after six weeks of healthy eating and more physical activity. Treating conditions that cause the obesity (underlying conditions). Surgery. Surgical options may include gastric banding and gastric bypass. Surgery may be done if: Other treatments have not helped to improve your condition. You have a BMI of 40 or higher. You have life-threatening health problems related to obesity. Follow these instructions at home: Eating and drinking  Follow recommendations from your health care provider about what you eat and drink. Your health care provider may advise you to: Limit fast food, sweets, and processed snack foods. Choose low-fat options, such as low-fat milk instead of whole milk. Eat five or more servings of fruits or vegetables every day. Choose healthy foods when you eat out. Keep low-fat snacks available. Limit sugary drinks, such as soda, fruit juice, sweetened iced tea, and flavored milk. Drink enough water to keep your urine pale yellow. Do not follow a fad diet. Fad diets can be unhealthy and even dangerous. Other healthful choices include: Eat at home more often. This gives you more control over what you eat. Learn to read food  labels. This will help you understand how much food is considered one  serving. Learn what a healthy serving size is. Physical activity Exercise regularly, as told by your health care provider. Most adults should get up to 150 minutes of moderate-intensity exercise every week. Ask your health care provider what types of exercise are safe for you and how often you should exercise. Warm up and stretch before being active. Cool down and stretch after being active. Rest between periods of activity. Lifestyle Work with your health care provider and a dietitian to set a weight-loss goal that is healthy and reasonable for you. Limit your screen time. Find ways to reward yourself that do not involve food. Do not drink alcohol if: Your health care provider tells you not to drink. You are pregnant, may be pregnant, or are planning to become pregnant. If you drink alcohol: Limit how much you have to: 0-1 drink a day for women. 0-2 drinks a day for men. Know how much alcohol is in your drink. In the [place] one drink equals one 12 oz bottle of beer (355 mL), one 5 oz glass of wine (148 mL), or one 1 oz glass of hard liquor (44 mL). General instructions Keep a weight-loss journal to keep track of the food you eat and how much exercise you get. Take over-the-counter and prescription medicines only as told by your health care provider. Take vitamins and supplements only as told by your health care provider. Consider joining a support group. Your health care provider may be able to recommend a support group. Pay attention to your mental health as obesity can lead to depression or self esteem issues. Keep all follow-up visits. This is important. Contact a health care provider if: You are unable to meet your weight-loss goal after six weeks of dietary and lifestyle changes. You have trouble breathing. Summary Obesity is the condition of having too much total body fat. Being overweight or obese means that your weight is greater than what is considered healthy for your body  size. Work with your health care provider and a dietitian to set a weight-loss goal that is healthy and reasonable for you. Exercise regularly, as told by your health care provider. Ask your health care provider what types of exercise are safe for you and how often you should exercise. This information is not intended to replace advice given to you by your health care provider. Make sure you discuss any questions you have with your health care provider. Document Revised: 02/28/2021 Document Reviewed: 02/28/2021 Elsevier Patient Education  [place]. Hypertension, Adult High blood pressure (hypertension) is when the force of blood pumping through the arteries is too strong. The arteries are the blood vessels that carry blood from the heart throughout the body. Hypertension forces the heart to work harder to pump blood and may cause arteries to become narrow or stiff. Untreated or uncontrolled hypertension can lead to a heart attack, heart failure, a stroke, kidney disease, and other problems. A blood pressure reading consists of a higher number over a lower number. Ideally, your blood pressure should be below 120/80. The first ("top") number is called the systolic pressure. It is a measure of the pressure in your arteries as your heart beats. The second ("bottom") number is called the diastolic pressure. It is a measure of the pressure in your arteries as the heart relaxes. What are the causes? The exact cause of this condition is not known. There are some conditions that result in  high blood pressure. What increases the risk? Certain factors may make you more likely to develop high blood pressure. Some of these risk factors are under your control, including: Smoking. Not getting enough exercise or physical activity. Being overweight. Having too much fat, sugar, calories, or salt (sodium) in your diet. Drinking too much alcohol. Other risk factors include: Having a personal history of  heart disease, diabetes, high cholesterol, or kidney disease. Stress. Having a family history of high blood pressure and high cholesterol. Having obstructive sleep apnea. Age. The risk increases with age. What are the signs or symptoms? High blood pressure may not cause symptoms. Very high blood pressure (hypertensive crisis) may cause: Headache. Fast or irregular heartbeats (palpitations). Shortness of breath. Nosebleed. Nausea and vomiting. Vision changes. Severe chest pain, dizziness, and seizures. How is this diagnosed? This condition is diagnosed by measuring your blood pressure while you are seated, with your arm resting on a flat surface, your legs uncrossed, and your feet flat on the floor. The cuff of the blood pressure monitor will be placed directly against the skin of your upper arm at the level of your heart. Blood pressure should be measured at least twice using the same arm. Certain conditions can cause a difference in blood pressure between your right and left arms. If you have a high blood pressure reading during one visit or you have normal blood pressure with other risk factors, you may be asked to: Return on a different day to have your blood pressure checked again. Monitor your blood pressure at home for 1 week or longer. If you are diagnosed with hypertension, you may have other blood or imaging tests to help your health care provider understand your overall risk for other conditions. How is this treated? This condition is treated by making healthy lifestyle changes, such as eating healthy foods, exercising more, and reducing your alcohol intake. You may be referred for counseling on a healthy diet and physical activity. Your health care provider may prescribe medicine if lifestyle changes are not enough to get your blood pressure under control and if: Your systolic blood pressure is above 130. Your diastolic blood pressure is above 80. Your personal target blood  pressure may vary depending on your medical conditions, your age, and other factors. Follow these instructions at home: Eating and drinking  Eat a diet that is high in fiber and potassium, and low in sodium, added sugar, and fat. An example of this eating plan is called the DASH diet. DASH stands for Dietary Approaches to Stop Hypertension. To eat this way: Eat plenty of fresh fruits and vegetables. Try to fill one half of your plate at each meal with fruits and vegetables. Eat whole grains, such as whole-wheat pasta, brown rice, or whole-grain bread. Fill about one fourth of your plate with whole grains. Eat or drink low-fat dairy products, such as skim milk or low-fat yogurt. Avoid fatty cuts of meat, processed or cured meats, and poultry with skin. Fill about one fourth of your plate with lean proteins, such as fish, chicken without skin, beans, eggs, or tofu. Avoid pre-made and processed foods. These tend to be higher in sodium, added sugar, and fat. Reduce your daily sodium intake. Many people with hypertension should eat less than 1,500 mg of sodium a day. Do not drink alcohol if: Your health care provider tells you not to drink. You are pregnant, may be pregnant, or are planning to become pregnant. If you drink alcohol: Limit how much you  have to: 0-1 drink a day for women. 0-2 drinks a day for men. Know how much alcohol is in your drink. In the [place] one drink equals one 12 oz bottle of beer (355 mL), one 5 oz glass of wine (148 mL), or one 1 oz glass of hard liquor (44 mL). Lifestyle  Work with your health care provider to maintain a healthy body weight or to lose weight. Ask what an ideal weight is for you. Get at least 30 minutes of exercise that causes your heart to beat faster (aerobic exercise) most days of the week. Activities may include walking, swimming, or biking. Include exercise to strengthen your muscles (resistance exercise), such as Pilates or lifting weights, as part  of your weekly exercise routine. Try to do these types of exercises for 30 minutes at least 3 days a week. Do not use any products that contain nicotine or tobacco. These products include cigarettes, chewing tobacco, and vaping devices, such as e-cigarettes. If you need help quitting, ask your health care provider. Monitor your blood pressure at home as told by your health care provider. Keep all follow-up visits. This is important. Medicines Take over-the-counter and prescription medicines only as told by your health care provider. Follow directions carefully. Blood pressure medicines must be taken as prescribed. Do not skip doses of blood pressure medicine. Doing this puts you at risk for problems and can make the medicine less effective. Ask your health care provider about side effects or reactions to medicines that you should watch for. Contact a health care provider if you: Think you are having a reaction to a medicine you are taking. Have headaches that keep coming back (recurring). Feel dizzy. Have swelling in your ankles. Have trouble with your vision. Get help right away if you: Develop a severe headache or confusion. Have unusual weakness or numbness. Feel faint. Have severe pain in your chest or abdomen. Vomit repeatedly. Have trouble breathing. These symptoms may be an emergency. Get help right away. Call 911. Do not wait to see if the symptoms will go away. Do not drive yourself to the hospital. Summary Hypertension is when the force of blood pumping through your arteries is too strong. If this condition is not controlled, it may put you at risk for serious complications. Your personal target blood pressure may vary depending on your medical conditions, your age, and other factors. For most people, a normal blood pressure is less than 120/80. Hypertension is treated with lifestyle changes, medicines, or a combination of both. Lifestyle changes include losing weight, eating a  healthy, low-sodium diet, exercising more, and limiting alcohol. This information is not intended to replace advice given to you by your health care provider. Make sure you discuss any questions you have with your health care provider. Document Revised: 05/30/2021 Document Reviewed: 05/30/2021 Elsevier Patient Education  2023 [employer]

## 2022-04-23 NOTE — Progress Notes (Signed)
[name], is a 36 y.o. female  [id number]  [id number]  DOB - 1985/09/06  Chief Complaint  Patient presents with   Hypertension       Subjective:   Kelly Sheppard is a 36 y.o. female here today for a follow up visit for the management of HTN.. Patient has No headache, No chest pain, No abdominal pain - No Nausea, No new weakness tingling or numbness, No Cough - shortness of breath. She also voiced concerns of back of back pain cervical, thoracic, lumbar and sacral.  Difficulty trying to rest at night in a comfortable position pain level averages 8 out of 10.  Also discussed her weight being a contributing factor of back pain.  No problems updated.  Allergies  Allergen Reactions   Latex     Past Medical History:  Diagnosis Date   Essential hypertension 12/11/2021   Hypertension     Current Outpatient Medications on File Prior to Visit  Medication Sig Dispense Refill   levothyroxine (SYNTHROID) 50 MCG tablet PLEASE SEE ATTACHED FOR DETAILED DIRECTIONS (Patient not taking: Reported on 08/01/2021)     NIFEdipine (PROCARDIA-XL/NIFEDICAL-XL) 30 MG 24 hr tablet Take 1 tablet (30 mg total) by mouth daily. (Patient not taking: Reported on 04/23/2022) 90 tablet 0   VENTOLIN HFA 108 (90 Base) MCG/ACT inhaler TAKE 2 PUFFS BY MOUTH EVERY 6 HOURS AS NEEDED FOR WHEEZE OR SHORTNESS OF BREATH (Patient not taking: Reported on 04/23/2022) 18 each 1   No current facility-administered medications on file prior to visit.    Objective:   Vitals:   04/23/22 1109  BP: (!) 143/94  Pulse: 75  Resp: 16  SpO2: 98%  Weight: (!) 351 lb 3.2 oz (159.3 kg)  Height: 5\' 1"  (1.549 m)    Exam General appearance : Awake, alert, not in any distress. Speech Clear. Not toxic looking HEENT: Atraumatic and Normocephalic, pupils equally reactive to light and accomodation Neck: Supple, no JVD. No cervical lymphadenopathy.  Chest: Good air entry bilaterally, no added  sounds  CVS: S1 S2 regular, no murmurs.  Abdomen: Bowel sounds present, Non tender and not distended with no gaurding, rigidity or rebound. Extremities: B/L Lower Ext shows no edema, both legs are warm to touch Neurology: Awake alert, and oriented X 3,  Non focal Skin: No Rash  Data Review No results found for: "HGBA1C"  Assessment & Plan   1. Morbid obesity ([place]) - Ambulatory referral to Physical Therapy/ rt back  2. Essential hypertension Patient does not want to be on medication she will try lifestyle modification re check blood pressure and at that point we will discuss blood pressure medication options if remains elevated Counseled on blood pressure goal of less than 130/80, low-sodium, DASH diet, medication compliance, 150 minutes of moderate intensity exercise per week. Discussed medication compliance, adverse effects.   3. Multilevel spine pain States she has had this pain since she had her twins di fraternal - Ambulatory referral to Physical Therapy    Patient have been counseled extensively about nutrition and exercise. Other issues discussed during this visit include: low cholesterol diet, weight control and daily exercise, foot care, annual eye examinations at Ophthalmology, importance of adherence with medications and regular follow-up. We also discussed long term complications of uncontrolled diabetes and hypertension.   Return in about 4 weeks (around 05/21/2022) for Bp f/u/ weight review options.  The patient was given clear instructions to go to ER or return to medical center if symptoms don't  improve, worsen or new problems develop. The patient verbalized understanding. The patient was told to call to get lab results if they haven't heard anything in the next week.   This note has been created with Surveyor, quantity. Any transcriptional errors are unintentional.   Kerin Perna, NP 04/23/2022, 4:48 PM

## 2022-04-24 DIAGNOSIS — F411 Generalized anxiety disorder: Secondary | ICD-10-CM | POA: Diagnosis not present

## 2022-04-24 DIAGNOSIS — F331 Major depressive disorder, recurrent, moderate: Secondary | ICD-10-CM | POA: Diagnosis not present

## 2022-05-01 DIAGNOSIS — F411 Generalized anxiety disorder: Secondary | ICD-10-CM | POA: Diagnosis not present

## 2022-05-09 ENCOUNTER — Ambulatory Visit (HOSPITAL_BASED_OUTPATIENT_CLINIC_OR_DEPARTMENT_OTHER): Payer: BC Managed Care – PPO | Attending: Primary Care | Admitting: Physical Therapy

## 2022-05-09 ENCOUNTER — Encounter (HOSPITAL_BASED_OUTPATIENT_CLINIC_OR_DEPARTMENT_OTHER): Payer: Self-pay | Admitting: Physical Therapy

## 2022-05-09 DIAGNOSIS — M6281 Muscle weakness (generalized): Secondary | ICD-10-CM | POA: Insufficient documentation

## 2022-05-09 DIAGNOSIS — M549 Dorsalgia, unspecified: Secondary | ICD-10-CM | POA: Insufficient documentation

## 2022-05-09 DIAGNOSIS — M545 Low back pain, unspecified: Secondary | ICD-10-CM | POA: Insufficient documentation

## 2022-05-09 DIAGNOSIS — M5459 Other low back pain: Secondary | ICD-10-CM

## 2022-05-09 NOTE — Therapy (Signed)
OUTPATIENT PHYSICAL THERAPY THORACOLUMBAR EVALUATION   Patient Name: Kelly Sheppard MRN: [id number] DOB:[birth year], 36 y.o., female Today's Date: 05/09/2022   PT End of Session - 05/09/22 0824     Visit Number 1    Number of Visits 12    Date for PT Re-Evaluation 06/20/22    Authorization Type BCBS    PT Start Time 0818    PT Stop Time 0900    PT Time Calculation (min) 42 min    Activity Tolerance Patient tolerated treatment well    Behavior During Therapy [facility] for tasks assessed/performed             Past Medical History:  Diagnosis Date   Essential hypertension 12/11/2021   Hypertension    History reviewed. No pertinent surgical history. Patient Active Problem List   Diagnosis Date Noted   Essential hypertension 12/11/2021    PCP: Kelly Perna, NP  REFERRING PROVIDER: Kerin Perna, NP  REFERRING DIAG:  E66.01 (ICD-10-CM) - Morbid obesity ([place])  M54.9 (ICD-10-CM) - Multilevel spine pain    Rationale for Evaluation and Treatment Rehabilitation  THERAPY DIAG:  Other low back pain  Muscle weakness (generalized)  ONSET DATE: Jan 2023  SUBJECTIVE:                                                                                                                                                                                           SUBJECTIVE STATEMENT: I had a set of twins and since that my back and hip have hurt. Hurt during my pregnancy.  Pain is better. Most pain is with sit to stand transfer. Hips hurt most when walking up to 30 minutes but back doesn't bother me with walking. Was a cheerleading. Hard on my knees and hips. Pt reports she does have hx of insomnia back pain does  not limit her sleep but she does report some hip pain with side lying.Prior to preg pt worked physically demanding job. PERTINENT HISTORY:  HTN; Morbid obesity  PAIN:  Are you having pain? Yes: NPRS scale: Current 2/10 worst 7/10; least 0-1/10 Pain location: LB  Pain  description: sharp LB,ache shoulder to LB, ache hip Aggravating factors: sit to stand transfer; walking >30 mins and stair climbing Relieving factors: sitting, resting, lying down   PRECAUTIONS: Other: obesity  WEIGHT BEARING RESTRICTIONS No  FALLS:  Has patient fallen in last 6 months? 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  PLOF: Independent  PATIENT GOALS return to normal mobility, be able to get off the floor, reduce pain   OBJECTIVE:     PATIENT SURVEYS:  FOTO 44 with goal of 55    COGNITION:  Overall cognitive status: Within functional limits for tasks assessed     SENSATION: Left groin pain burning intermittent pt reports from c-section a few times a week    POSTURE:  appears wfl although due to pt body habitus difficult to assess  PALPATION: Slight TTP upper traps and cervical to mid thoracic paraspinals   LUMBAR ROM:  WFL no pain   LOWER EXTREMITY ROM:     WFL  LOWER EXTREMITY MMT:    MMT Right eval Left eval  Hip flexion 4 4  Hip extension    Hip abduction 5 5  Hip adduction 5 5  Hip internal rotation    Hip external rotation    Knee flexion 5 5  Knee extension 5 5  Ankle dorsiflexion    Ankle plantarflexion    Ankle inversion    Ankle eversion     (Blank rows = not tested)  Shoulder ROM wfl with pain scapular retraction and depression    FUNCTIONAL TESTS:  5 times sit to stand: 17.89 pain with initial rep, continues throughout but less with last Timed up and go (TUG): 13.22  GAIT: Distance walked: 500 Assistive device utilized: None Level of assistance: Complete Independence Comments: increased lateral deviation    TODAY'S TREATMENT  Eval Objective testing   PATIENT EDUCATION:  Education details: Discussed eval  findings, rehab rationale and POC and patient is in agreement Person educated: Patient Education method: Explanation Education comprehension: verbalized understanding   HOME EXERCISE PROGRAM: To be assigned  ASSESSMENT:  CLINICAL IMPRESSION: Patient is a 36 y.o. f who was seen today for physical therapy evaluation and treatment for back pain. Pt also with morbid obesity contributing to pain sx. Due to pt body habitus some assessing difficult. She presents with low reports of pain this am.  She has most difficulty with STS transfers with Back pain in lumbar area.  She also complains of some pain in her cervical spine and shoulders as well as thoracic spine which may be due at least in part to having a set of 5 months old twins.  She is morbidly obese and is wanting to lose weight which will be beneficial in decrease stress on all joints.  She has some weakness in her hips and LB, Rom wfl. Functional testing indicates limitations with mobility.  She will benefit from skilled aquatic physical therapy to improve mobility which will decrease pain sx returning pt to PLOF.   OBJECTIVE IMPAIRMENTS decreased activity tolerance, decreased mobility, difficulty walking, decreased strength, and obesity.   ACTIVITY LIMITATIONS carrying, lifting, bending, sleeping, stairs, caring for others, and STS transfers  PARTICIPATION LIMITATIONS: cleaning, laundry, community activity, occupation, and lifting twin 91 month old children  PERSONAL FACTORS Age, Behavior pattern, and Fitness are also affecting patient's functional outcome.   REHAB POTENTIAL: Good  CLINICAL DECISION MAKING: Stable/uncomplicated  EVALUATION COMPLEXITY: Low   GOALS: Goals reviewed with patient? Yes  SHORT TERM GOALS: Target date: 05/30/2022  Pt will be able to rise to standing position with pain <3/10 Baseline:7/10 Goal status: INITIAL  2.  Pt will be indep with proper body mechanics when lifting babies Baseline: poor body  mechanics Goal status: INITIAL  3.  Pt will improve on 5x STS to less than 12s Baseline: 17.89 Goal status: INITIAL  4.  Pt will report max pain in back and shoulders to be <4/10 Baseline: 7/10 Goal status: INITIAL   LONG TERM GOALS: Target date: 06/20/2022  Foto goal to be met 55% Baseline: 44% Goal status: INITIAL  2.  Pt will be able to get on and off of floor to interact with babies Baseline:  Goal status: INITIAL  3.  Pt will complete stair climbing using reciprocal pattern Baseline: step to Goal status: INITIAL  4.  Pt will complete STS transfers without pain Baseline: 7/10 Goal status: INITIAL  5.  Pt to follow up with [employer] for membership to have pool/gym access to further weight loss journey Baseline: no membership Goal status: INITIAL   PLAN: PT FREQUENCY: 1-2x/week  PT DURATION: 6 weeks  PLANNED INTERVENTIONS: Therapeutic exercises, Therapeutic activity, Neuromuscular re-education, Balance training, Gait training, Patient/Family education, Self Care, Joint mobilization, Stair training, Aquatic Therapy, Dry Needling, Cryotherapy, Moist heat, Taping, Manual therapy, and Re-evaluation.  PLAN FOR NEXT SESSION: initiate aquatic for general stretching and strengthening    Denton Meek, PT MPT 05/09/2022, 8:59 AM

## 2022-05-15 ENCOUNTER — Encounter (HOSPITAL_BASED_OUTPATIENT_CLINIC_OR_DEPARTMENT_OTHER): Payer: Self-pay | Admitting: Physical Therapy

## 2022-05-15 ENCOUNTER — Ambulatory Visit (HOSPITAL_BASED_OUTPATIENT_CLINIC_OR_DEPARTMENT_OTHER): Payer: BC Managed Care – PPO | Admitting: Physical Therapy

## 2022-05-15 DIAGNOSIS — M6281 Muscle weakness (generalized): Secondary | ICD-10-CM | POA: Diagnosis not present

## 2022-05-15 DIAGNOSIS — M549 Dorsalgia, unspecified: Secondary | ICD-10-CM | POA: Diagnosis not present

## 2022-05-15 DIAGNOSIS — M545 Low back pain, unspecified: Secondary | ICD-10-CM | POA: Diagnosis not present

## 2022-05-15 DIAGNOSIS — M5459 Other low back pain: Secondary | ICD-10-CM

## 2022-05-15 NOTE — Therapy (Signed)
OUTPATIENT PHYSICAL THERAPY THORACOLUMBAR TREATMENT   Patient Name: Kelly Sheppard MRN: [id number] DOB:1986/03/30, 36 y.o., female Today's Date: 05/15/2022   PT End of Session - 05/15/22 1446     Visit Number 2    Number of Visits 12    Authorization Type BCBS    PT Start Time 7619    PT Stop Time 1524    PT Time Calculation (min) 39 min    Activity Tolerance Patient tolerated treatment well    Behavior During Therapy [facility] for tasks assessed/performed             Past Medical History:  Diagnosis Date   Essential hypertension 12/11/2021   Hypertension    History reviewed. No pertinent surgical history. Patient Active Problem List   Diagnosis Date Noted   Essential hypertension 12/11/2021    PCP: Kerin Perna, NP  REFERRING PROVIDER: Kerin Perna, NP  REFERRING DIAG:  E66.01 (ICD-10-CM) - Morbid obesity ([place])  M54.9 (ICD-10-CM) - Multilevel spine pain    Rationale for Evaluation and Treatment Rehabilitation  THERAPY DIAG:  Other low back pain  Muscle weakness (generalized)  ONSET DATE: Jan 2023  SUBJECTIVE:                                                                                                                                                                                           SUBJECTIVE STATEMENT: Pt reports no new changes since eval.   PERTINENT HISTORY:  HTN; Morbid obesity  PAIN:  Are you having pain? Yes: NPRS scale: Current 2-3/10  Pain location: midback to LB  Pain description: sharp LB,ache shoulder to LB, ache hip Aggravating factors: sit to stand transfer; walking >30 mins and stair climbing Relieving factors: sitting, resting, lying down   PRECAUTIONS: Other: obesity  WEIGHT BEARING RESTRICTIONS No  FALLS:  Has patient fallen in last 6 months? 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  PLOF: Independent  PATIENT GOALS return to normal mobility, be able to get off the floor, reduce pain   OBJECTIVE:     PATIENT SURVEYS:  FOTO 44 with goal of 55    COGNITION:  Overall cognitive status: Within functional limits for tasks assessed     SENSATION: Left groin pain burning intermittent pt reports from c-section a few times a week    POSTURE:  appears wfl although due to pt body habitus difficult to assess  PALPATION: Slight TTP upper traps and cervical  to mid thoracic paraspinals   LUMBAR ROM:  WFL no pain   LOWER EXTREMITY ROM:     WFL  LOWER EXTREMITY MMT:    MMT Right eval Left eval  Hip flexion 4 4  Hip extension    Hip abduction 5 5  Hip adduction 5 5  Hip internal rotation    Hip external rotation    Knee flexion 5 5  Knee extension 5 5  Ankle dorsiflexion    Ankle plantarflexion    Ankle inversion    Ankle eversion     (Blank rows = not tested)  Shoulder ROM wfl with pain scapular retraction and depression    FUNCTIONAL TESTS:  5 times sit to stand: 17.89 pain with initial rep, continues throughout but less with last Timed up and go (TUG): 13.22  GAIT: Distance walked: 500 Assistive device utilized: None Level of assistance: Complete Independence Comments: increased lateral deviation    TODAY'S TREATMENT  Pt seen for aquatic therapy today.  Treatment took place in water 3.25-4 ft in depth at the [facility] pool. Temp of water was 91.  Pt entered/exited the pool via stairs independently with bilat rail.  Intro to [employer] Without support: walking forward/backward and side stepping At wall:  heel raises:  hip abdct and ext x 10 x 2 Side step squat L/R x 4 widths Ab set with thin square noodle x 10; repeated with yellow noodle x 5 - cues to unlock knees and for more upright posture Straddling noodle:  forward cycling with doggy paddle  arms Staggered stance with kick board (submerged) row motion  Hip hinge forward with arms on kick board x 10;  trunk rotation with arms on kick board   Pt requires the buoyancy and hydrostatic pressure of water for support, and to offload joints by unweighting joint load by at least 50 % in navel deep water and by at least 75-80% in chest to neck deep water.  Viscosity of the water is needed for resistance of strengthening. Water current perturbations provides challenge to standing balance requiring increased core activation.  PATIENT EDUCATION:  Education details: Discussed eval findings, rehab rationale and POC and patient is in agreement Person educated: Patient Education method: Explanation Education comprehension: verbalized understanding   HOME EXERCISE PROGRAM: To be assigned  ASSESSMENT:  CLINICAL IMPRESSION: Pt is confident in aquatic environment; able to take direction from therapist on deck.  She reported greater ease with bending knees and less back pain while in the water.  She will benefit from skilled aquatic physical therapy to improve mobility which will decrease pain sx returning pt to PLOF. Goals are ongoing.    OBJECTIVE IMPAIRMENTS decreased activity tolerance, decreased mobility, difficulty walking, decreased strength, and obesity.   ACTIVITY LIMITATIONS carrying, lifting, bending, sleeping, stairs, caring for others, and STS transfers  PARTICIPATION LIMITATIONS: cleaning, laundry, community activity, occupation, and lifting twin 58 month old children  PERSONAL FACTORS Age, Behavior pattern, and Fitness are also affecting patient's functional outcome.   REHAB POTENTIAL: Good  CLINICAL DECISION MAKING: Stable/uncomplicated  EVALUATION COMPLEXITY: Low   GOALS: Goals reviewed with patient? Yes  SHORT TERM GOALS: Target date: 06/05/2022  Pt will be able to rise to standing position with pain <3/10 Baseline:7/10 Goal status: INITIAL  2.  Pt will be indep  with proper body mechanics when lifting babies Baseline: poor body mechanics Goal status: INITIAL  3.  Pt will improve on 5x STS to less than 12s Baseline: 17.89 Goal status: INITIAL  4.  Pt will report max pain in back and shoulders to be <4/10 Baseline: 7/10 Goal status: INITIAL   LONG TERM GOALS: Target date: 06/26/2022  Foto goal to be met 55% Baseline: 44% Goal status: INITIAL  2.  Pt will be able to get on and off of floor to interact with babies Baseline:  Goal status: INITIAL  3.  Pt will complete stair climbing using reciprocal pattern Baseline: step to Goal status: INITIAL  4.  Pt will complete STS transfers without pain Baseline: 7/10 Goal status: INITIAL  5.  Pt to follow up with [employer] for membership to have pool/gym access to further weight loss journey Baseline: no membership Goal status: INITIAL   PLAN: PT FREQUENCY: 1-2x/week  PT DURATION: 6 weeks  PLANNED INTERVENTIONS: Therapeutic exercises, Therapeutic activity, Neuromuscular re-education, Balance training, Gait training, Patient/Family education, Self Care, Joint mobilization, Stair training, Aquatic Therapy, Dry Needling, Cryotherapy, Moist heat, Taping, Manual therapy, and Re-evaluation.  PLAN FOR NEXT SESSION: initiate aquatic for general stretching and strengthening - add stairs leading with LLE.   Kerin Perna, PTA 05/15/22 3:32 PM [facility] [address] [place], [place], [address] Phone: [phone]   Fax:  [phone]

## 2022-05-17 ENCOUNTER — Encounter (HOSPITAL_BASED_OUTPATIENT_CLINIC_OR_DEPARTMENT_OTHER): Payer: Self-pay | Admitting: Physical Therapy

## 2022-05-17 ENCOUNTER — Ambulatory Visit (HOSPITAL_BASED_OUTPATIENT_CLINIC_OR_DEPARTMENT_OTHER): Payer: BC Managed Care – PPO | Admitting: Physical Therapy

## 2022-05-17 DIAGNOSIS — M5459 Other low back pain: Secondary | ICD-10-CM

## 2022-05-17 DIAGNOSIS — M6281 Muscle weakness (generalized): Secondary | ICD-10-CM

## 2022-05-17 DIAGNOSIS — M545 Low back pain, unspecified: Secondary | ICD-10-CM | POA: Diagnosis not present

## 2022-05-17 DIAGNOSIS — M549 Dorsalgia, unspecified: Secondary | ICD-10-CM | POA: Diagnosis not present

## 2022-05-17 NOTE — Therapy (Signed)
OUTPATIENT PHYSICAL THERAPY THORACOLUMBAR TREATMENT   Patient Name: Kelly Sheppard MRN: [id number] DOB:02-29-1986, 36 y.o., female Today's Date: 05/17/2022   PT End of Session - 05/17/22 1542     Visit Number 3    Number of Visits 12    Date for PT Re-Evaluation 06/20/22    Authorization Type BCBS    PT Start Time 1535    PT Stop Time 1613    PT Time Calculation (min) 38 min    Activity Tolerance Patient tolerated treatment well    Behavior During Therapy [facility] for tasks assessed/performed             Past Medical History:  Diagnosis Date   Essential hypertension 12/11/2021   Hypertension    History reviewed. No pertinent surgical history. Patient Active Problem List   Diagnosis Date Noted   Essential hypertension 12/11/2021    PCP: Kerin Perna, NP  REFERRING PROVIDER: Kerin Perna, NP  REFERRING DIAG:  E66.01 (ICD-10-CM) - Morbid obesity ([place])  M54.9 (ICD-10-CM) - Multilevel spine pain    Rationale for Evaluation and Treatment Rehabilitation  THERAPY DIAG:  Other low back pain  Muscle weakness (generalized)  ONSET DATE: Jan 2023  SUBJECTIVE:                                                                                                                                                                                           SUBJECTIVE STATEMENT: Pt reports she was sitting on bed a lot today, with "poor posture".  She was a little sore after last session, "I felt like I had a little work out"   PERTINENT HISTORY:  HTN; Morbid obesity  PAIN:  Are you having pain? Yes: NPRS scale: Current 4/10  Pain location: midback to LB  Pain description: sharp LB,ache shoulder to LB, ache hip Aggravating factors: sit to stand transfer; walking >30 mins and stair climbing Relieving factors: sitting, resting, lying down   PRECAUTIONS: Other: obesity  WEIGHT BEARING RESTRICTIONS No  FALLS:  Has patient fallen in last 6 months? 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  PLOF: Independent  PATIENT GOALS return to normal mobility, be able to get off the floor, reduce pain   OBJECTIVE: * Findings taken at evaluation unless otherwise stated.     PATIENT SURVEYS:  FOTO 44 with goal of 11    COGNITION:  Overall cognitive status: Within functional limits for tasks assessed  SENSATION: Left groin pain burning intermittent pt reports from c-section a few times a week    POSTURE:  appears wfl although due to pt body habitus difficult to assess  PALPATION: Slight TTP upper traps and cervical to mid thoracic paraspinals   LUMBAR ROM:  WFL no pain   LOWER EXTREMITY ROM:     WFL  LOWER EXTREMITY MMT:    MMT Right eval Left eval  Hip flexion 4 4  Hip extension    Hip abduction 5 5  Hip adduction 5 5  Hip internal rotation    Hip external rotation    Knee flexion 5 5  Knee extension 5 5  Ankle dorsiflexion    Ankle plantarflexion    Ankle inversion    Ankle eversion     (Blank rows = not tested)  Shoulder ROM wfl with pain scapular retraction and depression    FUNCTIONAL TESTS:  5 times sit to stand: 17.89 pain with initial rep, continues throughout but less with last Timed up and go (TUG): 13.22  GAIT: Distance walked: 500 Assistive device utilized: None Level of assistance: Complete Independence Comments: increased lateral deviation    TODAY'S TREATMENT  Pt seen for aquatic therapy today.  Treatment took place in water 3.25-4 ft in depth at the [facility] pool. Temp of water was 92.  Pt entered/exited the pool via stairs independently with bilat rail.   Without support: walking forward/backward and side stepping Forward walking kicks Side step squat with yellow hand buoys  abd/add;  forward/ backward walking lunges with yellow hand buoys at surface Light jog forward/ backward  At wall:  heel /toe raises x 20;   hip abdct and ext x 20 Ab set with thin square noodle x 10; repeated with yellow noodle x 10  Staggered stance with kick board (submerged) row motion  Straddling noodle:  forward cycling with breast stroke arms; CC ski; jumping jack LEs  Hip hinge forward with arms on kick board x 10;  trunk rotation with arms on kick board  STS on 4th step x 5  Pt requires the buoyancy and hydrostatic pressure of water for support, and to offload joints by unweighting joint load by at least 50 % in navel deep water and by at least 75-80% in chest to neck deep water.  Viscosity of the water is needed for resistance of strengthening. Water current perturbations provides challenge to standing balance requiring increased core activation.  PATIENT EDUCATION:  Education details: Discussed eval findings, rehab rationale and POC and patient is in agreement Person educated: Patient Education method: Explanation Education comprehension: verbalized understanding   HOME EXERCISE PROGRAM: To be assigned  ASSESSMENT:  CLINICAL IMPRESSION: Pt reported gradual reduction of back pain to 1/10.  She would benefit from work on [employer] (bend with knees vs just her back).  She will benefit from skilled aquatic physical therapy to improve mobility which will decrease pain sx returning pt to PLOF. Goals are ongoing.    OBJECTIVE IMPAIRMENTS decreased activity tolerance, decreased mobility, difficulty walking, decreased strength, and obesity.   ACTIVITY LIMITATIONS carrying, lifting, bending, sleeping, stairs, caring for others, and STS transfers  PARTICIPATION LIMITATIONS: cleaning, laundry, community activity, occupation, and lifting twin 10 month old children  PERSONAL FACTORS Age, Behavior pattern, and Fitness are also affecting patient's functional outcome.   REHAB  POTENTIAL: Good  CLINICAL DECISION MAKING: Stable/uncomplicated  EVALUATION COMPLEXITY: Low   GOALS: Goals reviewed with patient? Yes  SHORT TERM GOALS: Target date:  06/07/2022  Pt will be able to rise to standing position with pain <3/10 Baseline:7/10 Goal status: INITIAL  2.  Pt will be indep with proper body mechanics when lifting babies Baseline: poor body mechanics Goal status: INITIAL  3.  Pt will improve on 5x STS to less than 12s Baseline: 17.89 Goal status: INITIAL  4.  Pt will report max pain in back and shoulders to be <4/10 Baseline: 7/10 Goal status: INITIAL   LONG TERM GOALS: Target date: 06/28/2022  Foto goal to be met 55% Baseline: 44% Goal status: INITIAL  2.  Pt will be able to get on and off of floor to interact with babies Baseline:  Goal status: INITIAL  3.  Pt will complete stair climbing using reciprocal pattern Baseline: step to Goal status: INITIAL  4.  Pt will complete STS transfers without pain Baseline: 7/10 Goal status: INITIAL  5.  Pt to follow up with [employer] for membership to have pool/gym access to further weight loss journey Baseline: no membership Goal status: INITIAL   PLAN: PT FREQUENCY: 1-2x/week  PT DURATION: 6 weeks  PLANNED INTERVENTIONS: Therapeutic exercises, Therapeutic activity, Neuromuscular re-education, Balance training, Gait training, Patient/Family education, Self Care, Joint mobilization, Stair training, Aquatic Therapy, Dry Needling, Cryotherapy, Moist heat, Taping, Manual therapy, and Re-evaluation.  PLAN FOR NEXT SESSION: continue aquatic for general stretching and strengthening - add stairs leading with LLE.    Kerin Perna, PTA 05/17/22 4:16 PM [facility] [address] [place], [place], [address] Phone: [phone]   Fax:  [phone]

## 2022-05-18 ENCOUNTER — Telehealth: Payer: Self-pay | Admitting: *Deleted

## 2022-05-18 NOTE — Chronic Care Management (AMB) (Signed)
  Care Coordination   Note   05/18/2022 Name: NORMA MONTEMURRO MRN: [id number] DOB: [birth year]  JOHNASIA LIESE is a 36 y.o. year old female who sees Kerin Perna, NP for primary care. I reached out to Waynard Reeds by phone today to offer care coordination services.  Ms. Jenison was given information about Care Coordination services today including:   The Care Coordination services include support from the care team which includes your Nurse Coordinator, Clinical Social Worker, or Pharmacist.  The Care Coordination team is here to help remove barriers to the health concerns and goals most important to you. Care Coordination services are voluntary, and the patient may decline or stop services at any time by request to their care team member.   Care Coordination Consent Status: Patient agreed to services and verbal consent obtained.   Follow up plan:  Telephone appointment with care coordination team member scheduled for:  05/31/22  Encounter Outcome:  Pt. Scheduled  [place]  Direct Dial: [phone]

## 2022-05-21 ENCOUNTER — Ambulatory Visit (INDEPENDENT_AMBULATORY_CARE_PROVIDER_SITE_OTHER): Payer: BC Managed Care – PPO | Admitting: Primary Care

## 2022-05-22 ENCOUNTER — Ambulatory Visit (HOSPITAL_BASED_OUTPATIENT_CLINIC_OR_DEPARTMENT_OTHER): Payer: BC Managed Care – PPO | Admitting: Physical Therapy

## 2022-05-24 DIAGNOSIS — F331 Major depressive disorder, recurrent, moderate: Secondary | ICD-10-CM | POA: Diagnosis not present

## 2022-05-24 DIAGNOSIS — F411 Generalized anxiety disorder: Secondary | ICD-10-CM | POA: Diagnosis not present

## 2022-05-29 ENCOUNTER — Encounter (HOSPITAL_BASED_OUTPATIENT_CLINIC_OR_DEPARTMENT_OTHER): Payer: Self-pay | Admitting: Physical Therapy

## 2022-05-29 ENCOUNTER — Ambulatory Visit (HOSPITAL_BASED_OUTPATIENT_CLINIC_OR_DEPARTMENT_OTHER): Payer: BC Managed Care – PPO | Admitting: Physical Therapy

## 2022-05-29 DIAGNOSIS — F411 Generalized anxiety disorder: Secondary | ICD-10-CM | POA: Diagnosis not present

## 2022-05-29 DIAGNOSIS — M6281 Muscle weakness (generalized): Secondary | ICD-10-CM

## 2022-05-29 DIAGNOSIS — M5459 Other low back pain: Secondary | ICD-10-CM

## 2022-05-29 DIAGNOSIS — M549 Dorsalgia, unspecified: Secondary | ICD-10-CM | POA: Diagnosis not present

## 2022-05-29 DIAGNOSIS — M545 Low back pain, unspecified: Secondary | ICD-10-CM | POA: Diagnosis not present

## 2022-05-29 NOTE — Therapy (Signed)
OUTPATIENT PHYSICAL THERAPY THORACOLUMBAR TREATMENT   Patient Name: Kelly Sheppard MRN: [id number] DOB:1986/04/23, 36 y.o., female Today's Date: 05/29/2022   PT End of Session - 05/29/22 0737     Visit Number 4    Number of Visits 12    Date for PT Re-Evaluation 06/20/22    Authorization Type BCBS    PT Start Time 0731    PT Stop Time 0811    PT Time Calculation (min) 40 min    Activity Tolerance Patient tolerated treatment well    Behavior During Therapy [facility] for tasks assessed/performed             Past Medical History:  Diagnosis Date   Essential hypertension 12/11/2021   Hypertension    History reviewed. No pertinent surgical history. Patient Active Problem List   Diagnosis Date Noted   Essential hypertension 12/11/2021    PCP: Kerin Perna, NP  REFERRING PROVIDER: Kerin Perna, NP  REFERRING DIAG:  E66.01 (ICD-10-CM) - Morbid obesity ([place])  M54.9 (ICD-10-CM) - Multilevel spine pain    Rationale for Evaluation and Treatment Rehabilitation  THERAPY DIAG:  Other low back pain  Muscle weakness (generalized)  ONSET DATE: Jan 2023  SUBJECTIVE:                                                                                                                                                                                           SUBJECTIVE STATEMENT: Pt reports she has tried to bend her knees more when picking stuff up from floor/low areas.  Back and hip continue to hurt.    PERTINENT HISTORY:  HTN; Morbid obesity  PAIN:  Are you having pain? Yes: NPRS scale: Current 4/10  Pain location: midback to LB, into L hip   Pain description: ache Aggravating factors: sit to stand transfer; walking >30 mins and stair climbing Relieving factors: sitting, resting, lying down   PRECAUTIONS: Other: obesity  WEIGHT BEARING RESTRICTIONS No  FALLS:  Has patient fallen in last 6 months? 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  PLOF: Independent  PATIENT GOALS return to normal mobility, be able to get off the floor, reduce pain   OBJECTIVE: * Findings taken at evaluation unless otherwise stated.     PATIENT SURVEYS:  FOTO 44 with goal of 35    COGNITION:  Overall cognitive status: Within functional limits for tasks assessed     SENSATION: Left groin pain burning intermittent pt  reports from c-section a few times a week    POSTURE:  appears wfl although due to pt body habitus difficult to assess  PALPATION: Slight TTP upper traps and cervical to mid thoracic paraspinals   LUMBAR ROM:  WFL no pain   LOWER EXTREMITY ROM:     WFL  LOWER EXTREMITY MMT:    MMT Right eval Left eval  Hip flexion 4 4  Hip extension    Hip abduction 5 5  Hip adduction 5 5  Hip internal rotation    Hip external rotation    Knee flexion 5 5  Knee extension 5 5  Ankle dorsiflexion    Ankle plantarflexion    Ankle inversion    Ankle eversion     (Blank rows = not tested)  Shoulder ROM wfl with pain scapular retraction and depression    FUNCTIONAL TESTS:  5 times sit to stand: 17.89 pain with initial rep, continues throughout but less with last Timed up and go (TUG): 13.22  GAIT: Distance walked: 500 Assistive device utilized: None Level of assistance: Complete Independence Comments: increased lateral deviation    TODAY'S TREATMENT  Pt seen for aquatic therapy today.  Treatment took place in water 3.25-4 ft in depth at the [facility] pool. Temp of water was 92.  Pt entered/exited the pool via stairs independently with bilat rail.   Without support: walking forward/backward and side stepping, forward/backward light jogging; forward/ backward walking lunges Holding yellow noodle: heel /toe raises x  20; 3 way LE kick x 10 each LE Hip hinge forward with arms on kick board x 10;  trunk rotation with arms on kick board  Ab set with yellow noodle x 10 ;  curtsy lunges ; Warrior 1 lifting yellow noodle off of surface x 10 each LE forward Return to light job forward/ backward Straddling noodle:  forward cycling with breast stroke arms; CC ski; jumping jack LEs  At stairs:  Low back stretch, piriformis stretch   Pt requires the buoyancy and hydrostatic pressure of water for support, and to offload joints by unweighting joint load by at least 50 % in navel deep water and by at least 75-80% in chest to neck deep water.  Viscosity of the water is needed for resistance of strengthening. Water current perturbations provides challenge to standing balance requiring increased core activation.  PATIENT EDUCATION:  Education details: Discussed eval findings, rehab rationale and POC and patient is in agreement Person educated: Patient Education method: Explanation Education comprehension: verbalized understanding   HOME EXERCISE PROGRAM: To be assigned  ASSESSMENT:  CLINICAL IMPRESSION: Pt required minor cues to increase step length of LLE (forward/ backward).   Pt reported reduction of back pain to 2/10; tolerated all exercises well. Will try planks and additional stretches next visit.  She will benefit from skilled aquatic physical therapy to improve mobility which will decrease pain / sx returning pt to PLOF. Goals are ongoing.    OBJECTIVE IMPAIRMENTS decreased activity tolerance, decreased mobility, difficulty walking, decreased strength, and obesity.   ACTIVITY LIMITATIONS carrying, lifting, bending, sleeping, stairs, caring for others, and STS transfers  PARTICIPATION LIMITATIONS: cleaning, laundry, community activity, occupation, and lifting twin 54 month old children  PERSONAL FACTORS Age, Behavior pattern, and Fitness are also affecting patient's functional outcome.   REHAB POTENTIAL:  Good  CLINICAL DECISION MAKING: Stable/uncomplicated  EVALUATION COMPLEXITY: Low   GOALS: Goals reviewed with patient? Yes  SHORT TERM GOALS: Target date: 05/30/22  Pt will be able to  rise to standing position with pain <3/10 Baseline:7/10 Goal status: INITIAL  2.  Pt will be indep with proper body mechanics when lifting babies Baseline: poor body mechanics Goal status: INITIAL  3.  Pt will improve on 5x STS to less than 12s Baseline: 17.89 Goal status: INITIAL  4.  Pt will report max pain in back and shoulders to be <4/10 Baseline: 7/10 Goal status: INITIAL   LONG TERM GOALS: Target date: 06/20/22  Foto goal to be met 55% Baseline: 44% Goal status: INITIAL  2.  Pt will be able to get on and off of floor to interact with babies Baseline:  Goal status: INITIAL  3.  Pt will complete stair climbing using reciprocal pattern Baseline: step to Goal status: INITIAL  4.  Pt will complete STS transfers without pain Baseline: 7/10 Goal status: INITIAL  5.  Pt to follow up with [employer] for membership to have pool/gym access to further weight loss journey Baseline: no membership Goal status: INITIAL   PLAN: PT FREQUENCY: 1-2x/week  PT DURATION: 6 weeks  PLANNED INTERVENTIONS: Therapeutic exercises, Therapeutic activity, Neuromuscular re-education, Balance training, Gait training, Patient/Family education, Self Care, Joint mobilization, Stair training, Aquatic Therapy, Dry Needling, Cryotherapy, Moist heat, Taping, Manual therapy, and Re-evaluation.  PLAN FOR NEXT SESSION: continue aquatic for general stretching and strengthening - add stairs leading with LLE.   Kerin Perna, PTA 05/29/22 11:59 AM [facility] [address] [place], [place], [address] Phone: [phone]   Fax:  [phone]

## 2022-05-31 ENCOUNTER — Ambulatory Visit: Payer: Self-pay

## 2022-05-31 ENCOUNTER — Ambulatory Visit (HOSPITAL_BASED_OUTPATIENT_CLINIC_OR_DEPARTMENT_OTHER): Payer: BC Managed Care – PPO | Admitting: Physical Therapy

## 2022-05-31 ENCOUNTER — Encounter (HOSPITAL_BASED_OUTPATIENT_CLINIC_OR_DEPARTMENT_OTHER): Payer: Self-pay | Admitting: Physical Therapy

## 2022-05-31 DIAGNOSIS — M5459 Other low back pain: Secondary | ICD-10-CM

## 2022-05-31 DIAGNOSIS — M549 Dorsalgia, unspecified: Secondary | ICD-10-CM | POA: Diagnosis not present

## 2022-05-31 DIAGNOSIS — M6281 Muscle weakness (generalized): Secondary | ICD-10-CM | POA: Diagnosis not present

## 2022-05-31 DIAGNOSIS — M545 Low back pain, unspecified: Secondary | ICD-10-CM | POA: Diagnosis not present

## 2022-05-31 NOTE — Patient Outreach (Signed)
  Care Coordination   Initial Visit Note   05/31/2022 Name: Kelly Sheppard MRN: [id number] DOB: [birth year]  Kelly Sheppard is a 36 y.o. year old female who sees Kerin Perna, NP for primary care. I spoke with  Waynard Reeds by phone today.  What matters to the patients health and wellness today?  I need to reschedule my child has a doctors visit.    Goals Addressed             This Visit's Progress    Care Coordination Activities -follow up required        Care Coordination Interventions: Active listening / Reflection utilized  Discussed/.Educated Care Coordination Program        SDOH assessments and interventions completed:  No     Care Coordination Interventions Activated:  Yes  Care Coordination Interventions:  Yes, provided   Follow up plan: Follow up call scheduled for 11/1 11 am    Encounter Outcome:  Pt. Visit Completed   Lazaro Arms RN, BSN, [facility]   Phone: [phone]

## 2022-05-31 NOTE — Therapy (Signed)
OUTPATIENT PHYSICAL THERAPY THORACOLUMBAR TREATMENT   Patient Name: Kelly Sheppard MRN: [id number] DOB:[birth year], 36 y.o., female Today's Date: 05/31/2022   PT End of Session - 05/31/22 1040     Visit Number 5    Number of Visits 12    Date for PT Re-Evaluation 06/20/22    Authorization Type BCBS    PT Start Time 1030    PT Stop Time 1111    PT Time Calculation (min) 41 min    Activity Tolerance Patient tolerated treatment well    Behavior During Therapy [facility] for tasks assessed/performed             Past Medical History:  Diagnosis Date   Essential hypertension 12/11/2021   Hypertension    History reviewed. No pertinent surgical history. Patient Active Problem List   Diagnosis Date Noted   Essential hypertension 12/11/2021    PCP: Kerin Perna, NP  REFERRING PROVIDER: Kerin Perna, NP  REFERRING DIAG:  E66.01 (ICD-10-CM) - Morbid obesity ([place])  M54.9 (ICD-10-CM) - Multilevel spine pain    Rationale for Evaluation and Treatment Rehabilitation  THERAPY DIAG:  Other low back pain  Muscle weakness (generalized)  ONSET DATE: Jan 2023  SUBJECTIVE:                                                                                                                                                                                           SUBJECTIVE STATEMENT: Pt reports she had a near fall yesterday; "I tweaked everything". She is realizing she does a lot of things with bad body mechanics.    PERTINENT HISTORY:  HTN; Morbid obesity  PAIN:  Are you having pain? Yes: NPRS scale: Current 4/10  Pain location: midback to LB, into L hip; bilat knees  Pain description: ache Aggravating factors: sit to stand transfer; walking >30 mins and stair climbing Relieving factors: sitting, resting, lying down   PRECAUTIONS: Other: obesity  WEIGHT BEARING RESTRICTIONS No  FALLS:  Has patient fallen in last 6 months? 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  PLOF: Independent  PATIENT GOALS return to normal mobility, be able to get off the floor, reduce pain   OBJECTIVE: * Findings taken at evaluation unless otherwise stated.     PATIENT SURVEYS:  FOTO 44 with goal of 47    COGNITION:  Overall cognitive status: Within functional limits for tasks assessed     SENSATION: Left groin pain burning intermittent  pt reports from c-section a few times a week    POSTURE:  appears wfl although due to pt body habitus difficult to assess  PALPATION: Slight TTP upper traps and cervical to mid thoracic paraspinals   LUMBAR ROM:  WFL no pain   LOWER EXTREMITY ROM:     WFL  LOWER EXTREMITY MMT:    MMT Right eval Left eval  Hip flexion 4 4  Hip extension    Hip abduction 5 5  Hip adduction 5 5  Hip internal rotation    Hip external rotation    Knee flexion 5 5  Knee extension 5 5  Ankle dorsiflexion    Ankle plantarflexion    Ankle inversion    Ankle eversion     (Blank rows = not tested)  Shoulder ROM wfl with pain scapular retraction and depression    FUNCTIONAL TESTS:  5 times sit to stand: 17.89 pain with initial rep, continues throughout but less with last Timed up and go (TUG): 13.22  GAIT: Distance walked: 500 Assistive device utilized: None Level of assistance: Complete Independence Comments: increased lateral deviation    TODAY'S TREATMENT  Pt seen for aquatic therapy today.  Treatment took place in water 3.25-4 ft in depth at the [facility] pool. Temp of water was 92.  Pt entered/exited the pool via stairs independently with bilat rail.  Straddling noodle:  forward cycling with breast stroke arms; CC ski; jumping jack LEs  Walking lunges forward/ backward Side step squats R/L Switched to  Squats in place with cues for form/ alignment of trunk; added blue hand float (pushing under water)  Returned to side step squats with improved form and tolerance  Light jog forward/ backward Warrior 1 with back heel raise x 10 each LE forward Curtsy lunges with high hip raise/opp arm lift x 10 each (alternating) Resisted trunk rotation (small range) in slight squat, board vertical Hip hinge with hands on flat kick board, repeated with slight rotation R/L  Return to walking forward/ backward SLS with noodle stomp (thin square noodle)x 10 each STS on 4th step x 5 (improved)   Pt requires the buoyancy and hydrostatic pressure of water for support, and to offload joints by unweighting joint load by at least 50 % in navel deep water and by at least 75-80% in chest to neck deep water.  Viscosity of the water is needed for resistance of strengthening. Water current perturbations provides challenge to standing balance requiring increased core activation.  PATIENT EDUCATION:  Education details: Discussed eval findings, rehab rationale and POC and patient is in agreement Person educated: Patient Education method: Explanation Education comprehension: verbalized understanding   HOME EXERCISE PROGRAM: To be assigned  ASSESSMENT:  CLINICAL IMPRESSION:  Pt reported elimination of back/knee pain at end of session.  Trialed a variety of exercises challenging core.   She will benefit from skilled aquatic physical therapy to improve mobility which will decrease pain / sx returning pt to PLOF. Goals are ongoing.    OBJECTIVE IMPAIRMENTS decreased activity tolerance, decreased mobility, difficulty walking, decreased strength, and obesity.   ACTIVITY LIMITATIONS carrying, lifting, bending, sleeping, stairs, caring for others, and STS transfers  PARTICIPATION LIMITATIONS: cleaning, laundry, community activity, occupation, and lifting twin 73 month old children  PERSONAL FACTORS Age, Behavior pattern, and  Fitness are also affecting patient's functional outcome.   REHAB POTENTIAL: Good  CLINICAL DECISION MAKING: Stable/uncomplicated  EVALUATION COMPLEXITY: Low   GOALS: Goals reviewed with patient? Yes  SHORT TERM GOALS:  Target date: 05/30/22  Pt will be able to rise to standing position with pain <3/10 Baseline:7/10 Goal status: INITIAL  2.  Pt will be indep with proper body mechanics when lifting babies Baseline: poor body mechanics Goal status: INITIAL  3.  Pt will improve on 5x STS to less than 12s Baseline: 17.89 Goal status: INITIAL  4.  Pt will report max pain in back and shoulders to be <4/10 Baseline: 7/10 Goal status: INITIAL   LONG TERM GOALS: Target date: 06/20/22  Foto goal to be met 55% Baseline: 44% Goal status: INITIAL  2.  Pt will be able to get on and off of floor to interact with babies Baseline:  Goal status: INITIAL  3.  Pt will complete stair climbing using reciprocal pattern Baseline: step to Goal status: INITIAL  4.  Pt will complete STS transfers without pain Baseline: 7/10 Goal status: INITIAL  5.  Pt to follow up with [employer] for membership to have pool/gym access to further weight loss journey Baseline: no membership Goal status: INITIAL   PLAN: PT FREQUENCY: 1-2x/week  PT DURATION: 6 weeks  PLANNED INTERVENTIONS: Therapeutic exercises, Therapeutic activity, Neuromuscular re-education, Balance training, Gait training, Patient/Family education, Self Care, Joint mobilization, Stair training, Aquatic Therapy, Dry Needling, Cryotherapy, Moist heat, Taping, Manual therapy, and Re-evaluation.  PLAN FOR NEXT SESSION: continue aquatic for general stretching and strengthening - add stairs leading with LLE.   Kerin Perna, PTA 05/31/22 6:23 PM [facility] [address] [place], [place], [address] Phone: [phone]   Fax:  [phone]

## 2022-06-05 ENCOUNTER — Ambulatory Visit (HOSPITAL_BASED_OUTPATIENT_CLINIC_OR_DEPARTMENT_OTHER): Payer: BC Managed Care – PPO | Admitting: Physical Therapy

## 2022-06-05 DIAGNOSIS — F411 Generalized anxiety disorder: Secondary | ICD-10-CM | POA: Diagnosis not present

## 2022-06-06 ENCOUNTER — Ambulatory Visit: Payer: Self-pay

## 2022-06-06 NOTE — Patient Outreach (Signed)
  Care Coordination   Initial Visit Note   06/06/2022 Name: Kelly Sheppard MRN: [id number] DOB: 01-14-1986  Kelly Sheppard is a 36 y.o. year old female who sees Kerin Perna, NP for primary care. I spoke with  Waynard Reeds by phone today.  What matters to the patients health and wellness today?  No Care or Concernss.  She is able to follow up with her providers.   Goals Addressed             This Visit's Progress    COMPLETED: Care Coordination Activities - no follow up required        Care Coordination Interventions: Active listening / Reflection utilized  Discussed/.Educated Care Coordination Program   Care Coordination Interventions:Active listening / Reflection utilized  Emotional Support Provided Problem [place] strategies reviewed Discussed/.Educated Social Determinates of Health 2.    inform PCP if services needed in the future The patient does not get AWV,  She stated that she does not get flu or covid shots.         SDOH assessments and interventions completed:  Yes  SDOH Interventions Today    Flowsheet Row Most Recent Value  SDOH Interventions   Food Insecurity Interventions Intervention Not Indicated  Transportation Interventions Intervention Not Indicated        Care Coordination Interventions Activated:  Yes  Care Coordination Interventions:  Yes, provided   Follow up plan: No further intervention required.   Encounter Outcome:  Pt. Visit Completed   Lazaro Arms RN, BSN, [facility]   Phone: [phone]

## 2022-06-06 NOTE — Patient Instructions (Signed)
Visit Information  Thank you for taking time to visit with me today. Please don't hesitate to contact me if I can be of assistance to you.   Following are the goals we discussed today:   Goals Addressed             This Visit's Progress    COMPLETED: Care Coordination Activities - no follow up required        Care Coordination Interventions: Active listening / Reflection utilized  Discussed/.Educated Care Coordination Program   Care Coordination Interventions:Active listening / Reflection utilized  Emotional Support Provided Problem [place] strategies reviewed Discussed/.Educated Social Determinates of Health 2.    inform PCP if services needed in the future The patient does not get AWV,  She stated that she does not get flu or covid shots.          If you are experiencing a Mental Health or [place] or need someone to talk to, please call 1-800-273-TALK (toll free, 24 hour hotline)  Patient verbalizes understanding of instructions and care plan provided today and agrees to view in [place]. Active MyChart status and patient understanding of how to access instructions and care plan via MyChart confirmed with patient.     Lazaro Arms RN, BSN, [facility]   Phone: [phone]

## 2022-06-07 ENCOUNTER — Ambulatory Visit (HOSPITAL_BASED_OUTPATIENT_CLINIC_OR_DEPARTMENT_OTHER): Payer: BC Managed Care – PPO | Admitting: Physical Therapy

## 2022-06-12 ENCOUNTER — Ambulatory Visit (HOSPITAL_BASED_OUTPATIENT_CLINIC_OR_DEPARTMENT_OTHER): Payer: BC Managed Care – PPO | Attending: Primary Care | Admitting: Physical Therapy

## 2022-06-12 ENCOUNTER — Encounter (HOSPITAL_BASED_OUTPATIENT_CLINIC_OR_DEPARTMENT_OTHER): Payer: Self-pay | Admitting: Physical Therapy

## 2022-06-12 DIAGNOSIS — M6281 Muscle weakness (generalized): Secondary | ICD-10-CM | POA: Insufficient documentation

## 2022-06-12 DIAGNOSIS — F411 Generalized anxiety disorder: Secondary | ICD-10-CM | POA: Diagnosis not present

## 2022-06-12 DIAGNOSIS — M5459 Other low back pain: Secondary | ICD-10-CM | POA: Insufficient documentation

## 2022-06-12 NOTE — Therapy (Signed)
OUTPATIENT PHYSICAL THERAPY THORACOLUMBAR TREATMENT   Patient Name: Kelly Sheppard MRN: [id number] DOB:March 01, 1986, 36 y.o., female Today's Date: 06/12/2022   PT End of Session - 06/12/22 0832     Visit Number 6    Number of Visits 12    Date for PT Re-Evaluation 06/20/22    Authorization Type BCBS    PT Start Time 0818    PT Stop Time 0900    PT Time Calculation (min) 42 min    Behavior During Therapy [facility] for tasks assessed/performed             Past Medical History:  Diagnosis Date   Essential hypertension 12/11/2021   Hypertension    History reviewed. No pertinent surgical history. Patient Active Problem List   Diagnosis Date Noted   Essential hypertension 12/11/2021    PCP: Kerin Perna, NP  REFERRING PROVIDER: Kerin Perna, NP  REFERRING DIAG:  E66.01 (ICD-10-CM) - Morbid obesity ([place])  M54.9 (ICD-10-CM) - Multilevel spine pain    Rationale for Evaluation and Treatment Rehabilitation  THERAPY DIAG:  Other low back pain  Muscle weakness (generalized)  ONSET DATE: Jan 2023  SUBJECTIVE:                                                                                                                                                                                           SUBJECTIVE STATEMENT: Pt reports she has been bending her knees more when lifting babies and getting on/off floor.  Back is not painful, but L knee has more pain.   PERTINENT HISTORY:  HTN; Morbid obesity  PAIN:  Are you having pain? Yes: NPRS scale: Current 5/10  Pain location: L knee, medial Pain description: ache Aggravating factors: sit to stand transfer; walking >30 mins and stair climbing Relieving factors: sitting, resting, lying down   PRECAUTIONS: Other: obesity  WEIGHT BEARING RESTRICTIONS No  FALLS:  Has patient fallen in last 6 months? 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  PLOF: Independent  PATIENT GOALS return to normal mobility, be able to get off the floor, reduce pain   OBJECTIVE: * Findings taken at evaluation unless otherwise stated.     PATIENT SURVEYS:  FOTO 44 with goal of 52    COGNITION:  Overall cognitive status: Within functional limits for tasks assessed     SENSATION: Left groin pain burning intermittent pt reports from c-section a few times a week    POSTURE:  appears wfl although due to pt body habitus difficult to assess  PALPATION: Slight TTP upper traps and cervical to mid thoracic paraspinals   LUMBAR ROM:  WFL no pain   LOWER EXTREMITY ROM:     WFL  LOWER EXTREMITY MMT:    MMT Right eval Left eval  Hip flexion 4 4  Hip extension    Hip abduction 5 5  Hip adduction 5 5  Hip internal rotation    Hip external rotation    Knee flexion 5 5  Knee extension 5 5  Ankle dorsiflexion    Ankle plantarflexion    Ankle inversion    Ankle eversion     (Blank rows = not tested)  Shoulder ROM wfl with pain scapular retraction and depression    FUNCTIONAL TESTS:  5 times sit to stand: 17.89 pain with initial rep, continues throughout but less with last Timed up and go (TUG): 13.22  GAIT: Distance walked: 500 Assistive device utilized: None Level of assistance: Complete Independence Comments: increased lateral deviation    TODAY'S TREATMENT  Pt seen for aquatic therapy today.  Treatment took place in water 3.25-4.75 ft in depth at the [facility]. Temp of water was 92.  Pt entered/exited the pool via stairs independently with bilat rail.  Without support:  walking forward, backwards walking, forward backwards walking kicks, side step with squats, monster walk forward/ backward SLS with noodle stomp (thin square noodle)x 10 each;  SLS with opp LE hip abdct/ hip  ext x 10 each Straddling noodle:  forward cycling with breast stroke arms; CC ski; jumping jack LEs  TrA set with yellow noodle pull down x 10  Resisted trunk rotation (small range) in slight squat, board vertical Hip hinge with hands on flat kick board, repeated with slight rotation R/L  Return to walking forward/ backward  Once dried:  I strips of reg [place] tape applied to L medial knee joint line in x pattern with 20% stretch to increase proprioception and decompress tissue. Educated pt on safe removal technique and pt verbalized understanding.  Pt requires the buoyancy and hydrostatic pressure of water for support, and to offload joints by unweighting joint load by at least 50 % in navel deep water and by at least 75-80% in chest to neck deep water.  Viscosity of the water is needed for resistance of strengthening. Water current perturbations provides challenge to standing balance requiring increased core activation.  PATIENT EDUCATION:  Education details: Discussed eval findings, rehab rationale and POC and patient is in agreement Person educated: Patient Education method: Explanation Education comprehension: verbalized understanding   HOME EXERCISE PROGRAM: To be assigned  ASSESSMENT:  CLINICAL IMPRESSION:  Pt reported elimination of L knee pain when cycling; returned to 3/10 once feet were back on ground in water.  Discussed alternative positions to sit and bend; pt to continue working on hip hinge and knees flexed for low reaching.  Trial of ktape applied to Lt medial knee; will assess response next visit.   She will benefit from skilled aquatic physical therapy to improve mobility which will decrease pain / sx returning pt to PLOF. Goals are ongoing.    OBJECTIVE IMPAIRMENTS decreased activity tolerance, decreased mobility, difficulty walking, decreased strength, and obesity.   ACTIVITY LIMITATIONS carrying, lifting, bending, sleeping, stairs, caring for others, and STS  transfers  PARTICIPATION LIMITATIONS: cleaning, laundry, community activity, occupation, and lifting twin 69 month old children  PERSONAL FACTORS Age, Behavior pattern, and Fitness are also affecting  patient's functional outcome.   REHAB POTENTIAL: Good  CLINICAL DECISION MAKING: Stable/uncomplicated  EVALUATION COMPLEXITY: Low   GOALS: Goals reviewed with patient? Yes  SHORT TERM GOALS: Target date: 05/30/22  Pt will be able to rise to standing position with pain <3/10 Baseline: Goal status: Ongoing   2.  Pt will be indep with proper body mechanics when lifting babies Baseline: poor body mechanics- eval Goal status: Ongoing   3.  Pt will improve on 5x STS to less than 12s Baseline: 17.89 Goal status: INITIAL  4.  Pt will report max pain in back and shoulders to be <4/10 Baseline: 7/10 Goal status: INITIAL   LONG TERM GOALS: Target date: 06/20/22  Foto goal to be met 55% Baseline: 44% Goal status: INITIAL  2.  Pt will be able to get on and off of floor to interact with babies Baseline: can get on/off floor, but has increased L knee pain Goal status: Partially met -06/12/22  3.  Pt will complete stair climbing using reciprocal pattern Baseline: step to  /step through depending on Lt knee pain level  Goal status:Partially met -06/12/22  4.  Pt will complete STS transfers without pain Baseline: 7/10 Goal status: INITIAL  5.  Pt to follow up with [employer] for membership to have pool/gym access to further weight loss journey Baseline: no membership Goal status: INITIAL   PLAN: PT FREQUENCY: 1-2x/week  PT DURATION: 6 weeks  PLANNED INTERVENTIONS: Therapeutic exercises, Therapeutic activity, Neuromuscular re-education, Balance training, Gait training, Patient/Family education, Self Care, Joint mobilization, Stair training, Aquatic Therapy, Dry Needling, Cryotherapy, Moist heat, Taping, Manual therapy, and Re-evaluation.  PLAN FOR NEXT SESSION: continue aquatic for  general stretching and strengthening- assess goals.  POC ending soon.   Kerin Perna, PTA 06/12/22 12:51 PM [facility] [address] [place], [place], [address] Phone: [phone]   Fax:  [phone]

## 2022-06-13 NOTE — Therapy (Signed)
OUTPATIENT PHYSICAL THERAPY THORACOLUMBAR TREATMENT   Patient Name: Kelly Sheppard MRN: [id number] DOB:23-Apr-1986, 36 y.o., female Today's Date: 06/14/2022   PT End of Session - 06/14/22 1331     Visit Number 7    Number of Visits 23    Date for PT Re-Evaluation 08/09/22    Authorization Type BCBS    PT Start Time 0815    PT Stop Time 0900    PT Time Calculation (min) 45 min    Activity Tolerance Patient tolerated treatment well    Behavior During Therapy [facility] for tasks assessed/performed             Past Medical History:  Diagnosis Date   Essential hypertension 12/11/2021   Hypertension    History reviewed. No pertinent surgical history. Patient Active Problem List   Diagnosis Date Noted   Essential hypertension 12/11/2021    PCP: Kerin Perna, NP  REFERRING PROVIDER: Kerin Perna, NP  REFERRING DIAG:  E66.01 (ICD-10-CM) - Morbid obesity ([place])  M54.9 (ICD-10-CM) - Multilevel spine pain    Rationale for Evaluation and Treatment Rehabilitation  THERAPY DIAG:  Other low back pain - Plan: PT plan of care cert/re-cert  Muscle weakness (generalized) - Plan: PT plan of care cert/re-cert  ONSET DATE: Jan 2023  SUBJECTIVE:                                                                                                                                                                                           SUBJECTIVE STATEMENT: Pt reports knee is better but she twisted wrong this morning picking up son and has flaired her back.   PERTINENT HISTORY:  HTN; Morbid obesity  PAIN:  Are you having pain? Yes: NPRS scale: Current 3/10  Pain location: L knee, medial Pain description: ache Aggravating factors: sit to stand transfer; walking >30 mins and stair climbing Relieving factors: sitting, resting, lying down  LB:7/10   PRECAUTIONS: Other: obesity  WEIGHT BEARING RESTRICTIONS No  FALLS:  Has patient fallen in last 6 months? 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  PLOF: Independent  PATIENT GOALS return to normal mobility, be able to get off the floor, reduce pain   OBJECTIVE: * Findings taken at evaluation unless otherwise stated.     PATIENT SURVEYS:  FOTO 44 with goal of 55 06/14/22 38    COGNITION:  Overall cognitive status: Within functional limits for tasks assessed  SENSATION: Left groin pain burning intermittent pt reports from c-section a few times a week    POSTURE:  appears wfl although due to pt body habitus difficult to assess  PALPATION: Slight TTP upper traps and cervical to mid thoracic paraspinals   LUMBAR ROM:  WFL no pain   LOWER EXTREMITY ROM:     WFL  LOWER EXTREMITY MMT:    MMT Right eval Left eval Right / Left 06/14/22  Hip flexion 4 4 4-/5 p!  Hip extension     Hip abduction 5 5   Hip adduction 5 5   Hip internal rotation     Hip external rotation     Knee flexion 5 5   Knee extension 5 5   Ankle dorsiflexion     Ankle plantarflexion     Ankle inversion     Ankle eversion      (Blank rows = not tested)  Shoulder ROM wfl with pain scapular retraction and depression    FUNCTIONAL TESTS:  5 times sit to stand: 17.89 pain with initial rep, continues throughout but less with last Timed up and go (TUG): 13.22  11/9: 15.17 5 X STS         TUG: 12.40  GAIT: Distance walked: 500 Assistive device utilized: None Level of assistance: Complete Independence Comments: increased lateral deviation    TODAY'S TREATMENT    Objective testing  Self care: Body mechanics: lifting techniques, raising crib to decrease her bending, putting babies safely on bed rather than floor for play time.   Pt seen for aquatic therapy today.  Treatment took place in water  3.25-4.75 ft in depth at the [facility]. Temp of water was 92.  Pt entered/exited the pool via stairs independently with bilat rail.  Without support:  walking forward, backwards walking. Baby eagle stretches (rhomboid stretch) Shoulder retraction x 10 Straddling noodle:  forward cycling with breast stroke arms; CC ski; jumping jack LEs  TrA set with yellow noodle pull down x 10  Hip hinge with hands yellow noodle repeated with slight rotation R/L   Pt requires the buoyancy and hydrostatic pressure of water for support, and to offload joints by unweighting joint load by at least 50 % in navel deep water and by at least 75-80% in chest to neck deep water.  Viscosity of the water is needed for resistance of strengthening. Water current perturbations provides challenge to standing balance requiring increased core activation.  PATIENT EDUCATION:  Education details: Discussed eval findings, rehab rationale and POC and patient is in agreement Person educated: Patient Education method: Explanation Education comprehension: verbalized understanding   HOME EXERCISE PROGRAM: To be assigned  ASSESSMENT:  CLINICAL IMPRESSION:  PN: pt pain has been improving up until this early am when she twisted caring for one of her twins. Her pain does seem to vary moving from LB to mid thoracic area and has switched from left knee to right. She states pain ahs been around a 2/10 for past few weeks until today. She has been instructed on improved body mechanics which she reports has helped but has some issues with implementation due to caring for 62 month old twins. Despite decreases in her foto score and strength today likely secondary to increase in LBP (due to event this am) she has demonstrated improvements in her functional testing as in chart above. Her knee pain has mostly resolved and she is using better body mechanics with interaction with her infant twins.  She will  continue to benefit from  skilled Physical therapy to further improve her functional mobility, decrease pain, improve strength and body mechanics to manage her twins. Plan to begin transitioning onto land based sessions. She will be getting [facility] the [facility] soon.     OBJECTIVE IMPAIRMENTS decreased activity tolerance, decreased mobility, difficulty walking, decreased strength, and obesity.   ACTIVITY LIMITATIONS carrying, lifting, bending, sleeping, stairs, caring for others, and STS transfers  PARTICIPATION LIMITATIONS: cleaning, laundry, community activity, occupation, and lifting twin 18 month old children  PERSONAL FACTORS Age, Behavior pattern, and Fitness are also affecting patient's functional outcome.   REHAB POTENTIAL: Good  CLINICAL DECISION MAKING: Stable/uncomplicated  EVALUATION COMPLEXITY: Low   GOALS: Goals reviewed with patient? Yes  SHORT TERM GOALS: Target date: 05/30/22  Pt will be able to rise to standing position with pain <3/10 Baseline: Goal status: Ongoing   2.  Pt will be indep with proper body mechanics when lifting babies Baseline: poor body mechanics- eval Goal status: Ongoing   3.  Pt will improve on 5x STS to less than 12s Baseline: 17.89 Goal status: ongoing  4.  Pt will report max pain in back and shoulders to be <4/10 Baseline: 7/10 Goal status: Ongoing   LONG TERM GOALS: Target date: 08/09/22  Foto goal to be met 55% Baseline: 44% Goal status: onging  2.  Pt will be able to get on and off of floor to interact with babies Baseline: can get on/off floor, but has increased L knee pain Goal status: Partially met -06/12/22  3.  Pt will complete stair climbing using reciprocal pattern Baseline: step to  /step through depending on Lt knee pain level  Goal status:Partially met -06/12/22  4.  Pt will complete STS transfers without pain Baseline: 7/10 Goal status: INITIAL  5.  Pt to follow up with [employer] for membership to have pool/gym access to further weight  loss journey Baseline: no membership Goal status: ongoing   PLAN: PT FREQUENCY: 1-2x/week  PT DURATION: 8  PLANNED INTERVENTIONS: Therapeutic exercises, Therapeutic activity, Neuromuscular re-education, Balance training, Gait training, Patient/Family education, Self Care, Joint mobilization, Stair training, Aquatic Therapy, Dry Needling, Cryotherapy, Moist heat, Taping, Manual therapy, and Re-evaluation.  PLAN FOR NEXT SESSION: begin transitioning onto land based therapy for cont body mechanic instruction and good HEP for stretching and strengthening. Aquatics for progression of strengthening and final HEP  Stanton Kidney Tharon Aquas) Jemery Stacey MPT 06/14/22 1:53 PM [facility] [address] [place], [place], [address] Phone: [phone]   Fax:  [phone]

## 2022-06-14 ENCOUNTER — Ambulatory Visit (HOSPITAL_BASED_OUTPATIENT_CLINIC_OR_DEPARTMENT_OTHER): Payer: BC Managed Care – PPO | Admitting: Physical Therapy

## 2022-06-14 ENCOUNTER — Encounter (HOSPITAL_BASED_OUTPATIENT_CLINIC_OR_DEPARTMENT_OTHER): Payer: Self-pay | Admitting: Physical Therapy

## 2022-06-14 DIAGNOSIS — M5459 Other low back pain: Secondary | ICD-10-CM | POA: Diagnosis not present

## 2022-06-14 DIAGNOSIS — M6281 Muscle weakness (generalized): Secondary | ICD-10-CM | POA: Diagnosis not present

## 2022-06-15 ENCOUNTER — Telehealth (INDEPENDENT_AMBULATORY_CARE_PROVIDER_SITE_OTHER): Payer: BC Managed Care – PPO | Admitting: Primary Care

## 2022-06-15 ENCOUNTER — Other Ambulatory Visit (INDEPENDENT_AMBULATORY_CARE_PROVIDER_SITE_OTHER): Payer: Self-pay | Admitting: Primary Care

## 2022-06-15 DIAGNOSIS — Z0130 Encounter for examination of blood pressure without abnormal findings: Secondary | ICD-10-CM

## 2022-06-15 DIAGNOSIS — J4541 Moderate persistent asthma with (acute) exacerbation: Secondary | ICD-10-CM | POA: Diagnosis not present

## 2022-06-15 MED ORDER — ALBUTEROL SULFATE HFA 108 (90 BASE) MCG/ACT IN AERS: INHALATION_SPRAY | RESPIRATORY_TRACT | 1 refills | Status: AC

## 2022-06-15 NOTE — Progress Notes (Signed)
  [facility]  Virtual Visit via Telephone Note  I connected with Kelly Sheppard, on 06/15/2022 at 11:27 AM through an audio and video application  and verified that I am speaking with the correct person using two identifiers.   Consent: I discussed the limitations, risks, security and privacy concerns of performing an evaluation and management service by telephone and the availability of in person appointments. I also discussed with the patient that there may be a patient responsible charge related to this service. The patient expressed understanding and agreed to proceed.   Location of Patient: home  Location of Provider: [facility]   Persons participating in Telemedicine visit: Richardine Service,  NP  History of Present Illness: [name]. Coggins is a 36 year old obese female having a virtual visit for BP follow-up.  Previous visit discussed how to prevent hypertension.  She reports her blood pressure being 128/84 and stays somewhere in this range.  Last time she lower her blood pressure said.  Radford Pax is months fast foods increase her water and monitoring her sodium intake. Past Medical History:  Diagnosis Date   Essential hypertension 12/11/2021   Hypertension    Allergies  Allergen Reactions   Latex     Current Outpatient Medications on File Prior to Visit  Medication Sig Dispense Refill   levothyroxine (SYNTHROID) 50 MCG tablet PLEASE SEE ATTACHED FOR DETAILED DIRECTIONS (Patient not taking: Reported on 08/01/2021)     No current facility-administered medications on file prior to visit.    Observations/Objective: 128/84 today's reading per patient Assessment and Plan: Diagnoses and all orders for this visit:  BP check No medication needed at this time.  Moderate persistent asthma with acute exacerbation -     albuterol (VENTOLIN HFA) 108 (90 Base) MCG/ACT inhaler; TAKE 2 PUFFS BY  MOUTH EVERY 6 HOURS AS NEEDED FOR WHEEZE OR SHORTNESS OF BREATH    Follow Up Instructions: PRN   I discussed the assessment and treatment plan with the patient. The patient was provided an opportunity to ask questions and all were answered. The patient agreed with the plan and demonstrated an understanding of the instructions.   The patient was advised to call back or seek an in-person evaluation if the symptoms worsen or if the condition fails to improve as anticipated.   I provided 15 minutes total of non-face-to-face time during this encounter including median intraservice time, reviewing previous notes, investigations, ordering medications, medical decision making, coordinating care and patient verbalized understanding at the end of the visit.    This note has been created with Education officer, environmental. Any transcriptional errors are unintentional.   [name], NP 06/15/2022, 11:27 AM

## 2022-06-15 NOTE — Telephone Encounter (Signed)
Requested medication (s) are due for refill today - no  Requested medication (s) are on the active medication list -yes  Future visit scheduled -no  Last refill: 06/15/22  Notes to clinic: Pharmacy request: Alternative requested  Requested Prescriptions  Pending Prescriptions Disp Refills   albuterol (VENTOLIN HFA) 108 (90 Base) MCG/ACT inhaler [Pharmacy Med Name: ALBUTEROL HFA (VENTOLIN) INH] 18 each 1    Sig: TAKE 2 PUFFS BY MOUTH EVERY 6 HOURS AS NEEDED FOR WHEEZE OR SHORTNESS OF BREATH     Pulmonology:  Beta Agonists 2 Failed - 06/15/2022 12:55 PM      Failed - Last BP in normal range    BP Readings from Last 1 Encounters:  04/23/22 (!) 143/94         Passed - Last Heart Rate in normal range    Pulse Readings from Last 1 Encounters:  04/23/22 75         Passed - Valid encounter within last 12 months    Recent Outpatient Visits           Today BP check   [facility] [name], NP   1 month ago Morbid obesity (HCC)   [facility] [name], NP   10 months ago Essential hypertension   [facility] [name], NP   1 year ago Essential hypertension   [facility] [name], NP   1 year ago Essential hypertension   [facility] [name], NP                 Requested Prescriptions  Pending Prescriptions Disp Refills   albuterol (VENTOLIN HFA) 108 (90 Base) MCG/ACT inhaler [Pharmacy Med Name: ALBUTEROL HFA (VENTOLIN) INH] 18 each 1    Sig: TAKE 2 PUFFS BY MOUTH EVERY 6 HOURS AS NEEDED FOR WHEEZE OR SHORTNESS OF BREATH     Pulmonology:  Beta Agonists 2 Failed - 06/15/2022 12:55 PM      Failed - Last BP in normal range    BP Readings from Last 1 Encounters:  04/23/22 (!) 143/94         Passed - Last Heart Rate in normal range    Pulse Readings from Last 1 Encounters:  04/23/22 75         Passed -  Valid encounter within last 12 months    Recent Outpatient Visits           Today BP check   [facility] [name], NP   1 month ago Morbid obesity (HCC)   [facility] [name], NP   10 months ago Essential hypertension   [facility] [name], NP   1 year ago Essential hypertension   [facility] [name], NP   1 year ago Essential hypertension   [facility] [name], NP

## 2022-06-19 ENCOUNTER — Other Ambulatory Visit (INDEPENDENT_AMBULATORY_CARE_PROVIDER_SITE_OTHER): Payer: Self-pay | Admitting: Primary Care

## 2022-06-19 ENCOUNTER — Ambulatory Visit (HOSPITAL_BASED_OUTPATIENT_CLINIC_OR_DEPARTMENT_OTHER): Payer: BC Managed Care – PPO | Admitting: Physical Therapy

## 2022-06-19 DIAGNOSIS — F411 Generalized anxiety disorder: Secondary | ICD-10-CM | POA: Diagnosis not present

## 2022-06-19 DIAGNOSIS — F900 Attention-deficit hyperactivity disorder, predominantly inattentive type: Secondary | ICD-10-CM | POA: Diagnosis not present

## 2022-06-19 DIAGNOSIS — J4541 Moderate persistent asthma with (acute) exacerbation: Secondary | ICD-10-CM

## 2022-06-19 NOTE — Telephone Encounter (Signed)
Requested medications are due for refill today.  Unsure  Requested medications are on the active medications list.  yes  Last refill. 06/15/2022 #18 1 rf  Future visit scheduled.   no  Notes to clinic.  Pharmacy is requesting alternate medication.    Requested Prescriptions  Pending Prescriptions Disp Refills   albuterol (VENTOLIN HFA) 108 (90 Base) MCG/ACT inhaler [Pharmacy Med Name: ALBUTEROL HFA (VENTOLIN) INH] 18 each 1    Sig: TAKE 2 PUFFS BY MOUTH EVERY 6 HOURS AS NEEDED FOR WHEEZE OR SHORTNESS OF BREATH     Pulmonology:  Beta Agonists 2 Failed - 06/19/2022  3:51 PM      Failed - Last BP in normal range    BP Readings from Last 1 Encounters:  04/23/22 (!) 143/94         Passed - Last Heart Rate in normal range    Pulse Readings from Last 1 Encounters:  04/23/22 75         Passed - Valid encounter within last 12 months    Recent Outpatient Visits           4 days ago BP check   [facility] [name], NP   1 month ago Morbid obesity (HCC)   [facility] [name], NP   10 months ago Essential hypertension   [facility] [name], NP   1 year ago Essential hypertension   [facility] [name], NP   1 year ago Essential hypertension   [facility] [name], NP

## 2022-06-19 NOTE — Telephone Encounter (Signed)
Franky Macho could you take a look at this. It's asking for alternative

## 2022-06-19 NOTE — Telephone Encounter (Signed)
This is being taken care of

## 2022-06-21 ENCOUNTER — Ambulatory Visit (HOSPITAL_BASED_OUTPATIENT_CLINIC_OR_DEPARTMENT_OTHER): Payer: BC Managed Care – PPO | Admitting: Physical Therapy

## 2022-06-26 DIAGNOSIS — F411 Generalized anxiety disorder: Secondary | ICD-10-CM | POA: Diagnosis not present

## 2022-06-27 ENCOUNTER — Ambulatory Visit (HOSPITAL_BASED_OUTPATIENT_CLINIC_OR_DEPARTMENT_OTHER): Payer: BC Managed Care – PPO | Admitting: Physical Therapy

## 2022-06-27 ENCOUNTER — Encounter (HOSPITAL_BASED_OUTPATIENT_CLINIC_OR_DEPARTMENT_OTHER): Payer: Self-pay | Admitting: Physical Therapy

## 2022-06-27 DIAGNOSIS — M5459 Other low back pain: Secondary | ICD-10-CM

## 2022-06-27 DIAGNOSIS — M6281 Muscle weakness (generalized): Secondary | ICD-10-CM | POA: Diagnosis not present

## 2022-06-27 NOTE — Therapy (Signed)
OUTPATIENT PHYSICAL THERAPY THORACOLUMBAR TREATMENT   Patient Name: Kelly Sheppard MRN: [id number] DOB:01-Jul-1986, 36 y.o., female Today's Date: 06/27/2022   PT End of Session - 06/27/22 1312     Visit Number 8    Number of Visits 23    Date for PT Re-Evaluation 08/09/22    Authorization Type BCBS    PT Start Time 1312    PT Stop Time 1353    PT Time Calculation (min) 41 min    Activity Tolerance Patient tolerated treatment well    Behavior During Therapy [facility] for tasks assessed/performed             Past Medical History:  Diagnosis Date   Essential hypertension 12/11/2021   Hypertension    History reviewed. No pertinent surgical history. Patient Active Problem List   Diagnosis Date Noted   Essential hypertension 12/11/2021    PCP: Kerin Perna, NP  REFERRING PROVIDER: Kerin Perna, NP  REFERRING DIAG:  E66.01 (ICD-10-CM) - Morbid obesity ([place])  M54.9 (ICD-10-CM) - Multilevel spine pain    Rationale for Evaluation and Treatment Rehabilitation  THERAPY DIAG:  Other low back pain  Muscle weakness (generalized)  ONSET DATE: Jan 2023  SUBJECTIVE:                                                                                                                                                                                           SUBJECTIVE STATEMENT: My knees and hips hurt more but my back is on and off.   PERTINENT HISTORY:  HTN; Morbid obesity  PAIN:  Are you having pain? Yes: NPRS scale: Current 3/10  Pain location: L knee, medial Pain description: ache Aggravating factors: sit to stand transfer; walking >30 mins and stair climbing Relieving factors: sitting, resting, lying down  LB:7/10   PRECAUTIONS: Other: obesity , c-section 6 mo ago  WEIGHT BEARING RESTRICTIONS No  FALLS:  Has patient fallen in last 6 months? 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  PLOF: Independent  PATIENT GOALS return to normal mobility, be able to get off the floor, reduce pain   OBJECTIVE: * Findings taken at evaluation unless otherwise stated.     PATIENT SURVEYS:  FOTO 44 with goal of 55 06/14/22 38    COGNITION:  Overall cognitive status: Within functional limits for tasks assessed     SENSATION: Left groin pain burning intermittent pt reports from c-section a few times a week  POSTURE:  appears wfl although due to pt body habitus difficult to assess  PALPATION: Slight TTP upper traps and cervical to mid thoracic paraspinals   LUMBAR ROM:  WFL no pain   LOWER EXTREMITY ROM:     WFL  LOWER EXTREMITY MMT:    MMT Right eval Left eval Right / Left 06/14/22  Hip flexion 4 4 4-/5 p!  Hip extension     Hip abduction 5 5   Hip adduction 5 5   Hip internal rotation     Hip external rotation     Knee flexion 5 5   Knee extension 5 5   Ankle dorsiflexion     Ankle plantarflexion     Ankle inversion     Ankle eversion      (Blank rows = not tested)  Shoulder ROM wfl with pain scapular retraction and depression    FUNCTIONAL TESTS:  5 times sit to stand: 17.89 pain with initial rep, continues throughout but less with last Timed up and go (TUG): 13.22  11/9: 15.17 5 X STS         TUG: 12.40  GAIT: Distance walked: 500 Assistive device utilized: None Level of assistance: Complete Independence Comments: increased lateral deviation    TODAY'S TREATMENT    Treatment                            06/27/22:  Abdominal progression: post pelvic tilt, march, alt ext, alt UE flexion Double leg lower from 90 hip flexion, LE turned out Dead lift with 15lb kettle bell- glut set to stand Qped ab set- added alt UE lift L stretch   PATIENT EDUCATION:  Education details: Discussed eval findings, rehab  rationale and POC and patient is in agreement Person educated: Patient Education method: Explanation Education comprehension: verbalized understanding   HOME EXERCISE PROGRAM: [id number]  ASSESSMENT:  CLINICAL IMPRESSION: Patient demonstrates very limited core stability in bending.  Worked on using glutes to bring her body upright rather than using lumbar extensors.  Will continue to progress general abdominal strength for spine support.    OBJECTIVE IMPAIRMENTS decreased activity tolerance, decreased mobility, difficulty walking, decreased strength, and obesity.   ACTIVITY LIMITATIONS carrying, lifting, bending, sleeping, stairs, caring for others, and STS transfers  PARTICIPATION LIMITATIONS: cleaning, laundry, community activity, occupation, and lifting twin 66 month old children  PERSONAL FACTORS Age, Behavior pattern, and Fitness are also affecting patient's functional outcome.    GOALS: Goals reviewed with patient? Yes  SHORT TERM GOALS: Target date: 05/30/22  Pt will be able to rise to standing position with pain <3/10 Baseline: Goal status: Ongoing   2.  Pt will be indep with proper body mechanics when lifting babies Baseline: poor body mechanics- eval Goal status: Ongoing   3.  Pt will improve on 5x STS to less than 12s Baseline: 17.89 Goal status: ongoing  4.  Pt will report max pain in back and shoulders to be <4/10 Baseline: 7/10 Goal status: Ongoing   LONG TERM GOALS: Target date: 08/09/22  Foto goal to be met 55% Baseline: 44% Goal status: onging  2.  Pt will be able to get on and off of floor to interact with babies Baseline: can get on/off floor, but has increased L knee pain Goal status: Partially met -06/12/22  3.  Pt will complete stair climbing using reciprocal pattern Baseline: step to  /step through depending on Lt knee pain level  Goal  status:Partially met -06/12/22  4.  Pt will complete STS transfers without pain Baseline: 7/10 Goal  status: INITIAL  5.  Pt to follow up with [employer] for membership to have pool/gym access to further weight loss journey Baseline: no membership Goal status: ongoing   PLAN: PT FREQUENCY: 1-2x/week  PT DURATION: 8  PLANNED INTERVENTIONS: Therapeutic exercises, Therapeutic activity, Neuromuscular re-education, Balance training, Gait training, Patient/Family education, Self Care, Joint mobilization, Stair training, Aquatic Therapy, Dry Needling, Cryotherapy, Moist heat, Taping, Manual therapy, and Re-evaluation.  PLAN FOR NEXT SESSION: begin transitioning onto land based therapy for cont body mechanic instruction and good HEP for stretching and strengthening. Aquatics for progression of strengthening and final HEP  Antoine Fiallos C. Jaszmine Navejas PT, DPT 06/27/22 1:54 PM  [facility] [address] [place], [place], [address] Phone: [phone]   Fax:  [phone]

## 2022-07-01 NOTE — Therapy (Signed)
OUTPATIENT PHYSICAL THERAPY THORACOLUMBAR TREATMENT   Patient Name: Kelly Sheppard MRN: [id number] DOB:[birth year], 36 y.o., female Today's Date: 07/02/2022   PT End of Session - 07/02/22 1341     Visit Number 9    Number of Visits 23    Date for PT Re-Evaluation 08/09/22    Authorization Type [employer]    PT Start Time 0734    PT Stop Time 0815    PT Time Calculation (min) 41 min    Activity Tolerance Patient tolerated treatment well    Behavior During Therapy [facility] for tasks assessed/performed             Past Medical History:  Diagnosis Date   Essential hypertension 12/11/2021   Hypertension    History reviewed. No pertinent surgical history. Patient Active Problem List   Diagnosis Date Noted   Essential hypertension 12/11/2021    PCP: Kerin Perna, NP  REFERRING PROVIDER: Kerin Perna, NP  REFERRING DIAG:  E66.01 (ICD-10-CM) - Morbid obesity ([place])  M54.9 (ICD-10-CM) - Multilevel spine pain    Rationale for Evaluation and Treatment Rehabilitation  THERAPY DIAG:  Other low back pain  Muscle weakness (generalized)  ONSET DATE: Jan 2023  SUBJECTIVE:                                                                                                                                                                                           SUBJECTIVE STATEMENT: Good night.  No pain in knees LBP low  PERTINENT HISTORY:  HTN; Morbid obesity  PAIN:  Are you having pain? Yes: NPRS scale: Current 0/10  Pain location: L knee, medial Pain description: ache Aggravating factors: sit to stand transfer; walking >30 mins and stair climbing Relieving factors: sitting, resting, lying down  LB:2/10   PRECAUTIONS: Other: obesity , c-section 6 mo ago  WEIGHT BEARING RESTRICTIONS No  FALLS:  Has patient fallen in last 6 months? 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  PLOF: Independent  PATIENT GOALS return to normal mobility, be able to get off the floor, reduce pain   OBJECTIVE: * Findings taken at evaluation unless otherwise stated.     PATIENT SURVEYS:  FOTO 44 with goal of 55 06/14/22 38    COGNITION:  Overall cognitive status: Within functional limits for tasks assessed     SENSATION: Left groin pain burning intermittent pt reports from c-section a few times a week    POSTURE:  appears wfl although due to pt body habitus difficult to assess  PALPATION: Slight TTP upper traps and cervical to mid thoracic paraspinals   LUMBAR ROM:  WFL no pain   LOWER EXTREMITY ROM:     WFL  LOWER EXTREMITY MMT:    MMT Right eval Left eval Right / Left 06/14/22  Hip flexion 4 4 4-/5 p!  Hip extension     Hip abduction 5 5   Hip adduction 5 5   Hip internal rotation     Hip external rotation     Knee flexion 5 5   Knee extension 5 5   Ankle dorsiflexion     Ankle plantarflexion     Ankle inversion     Ankle eversion      (Blank rows = not tested)  Shoulder ROM wfl with pain scapular retraction and depression    FUNCTIONAL TESTS:  5 times sit to stand: 17.89 pain with initial rep, continues throughout but less with last Timed up and go (TUG): 13.22  11/9: 15.17 5 X STS         TUG: 12.40  GAIT: Distance walked: 500 Assistive device utilized: None Level of assistance: Complete Independence Comments: increased lateral deviation    TODAY'S TREATMENT     Treatment                            07/02/22:  Pt seen for aquatic therapy today.  Treatment took place in water 3.25-4.75 ft in depth at the [place] [facility]. Temp of water was 92.  Pt entered/exited the pool via stairs independently with bilat rail.   Without support:  walking forward, backwards walking. Forward backward amb with yellow hand  buoys submerged at sides Retro step ups bottom step Side step up R/L 2 x 10 Standing ue supported on wall hip extension with glute set at end range Kick board row staggered LE then together ea 2x15reps Kick board ptress  TrA set:7 x 10s hold submerged to arm pit Resisted core rotation for rotator strengthening R/L x10    Pt requires the buoyancy and hydrostatic pressure of water for support, and to offload joints by unweighting joint load by at least 50 % in navel deep water and by at least 75-80% in chest to neck deep water.  Viscosity of the water is needed for resistance of strengthening. Water current perturbations provides challenge to standing balance requiring increased core activation. Treatment                            06/27/22:  Abdominal progression: post pelvic tilt, march, alt ext, alt UE flexion Double leg lower from 90 hip flexion, LE turned out Dead lift with 15lb kettle bell- glut set to stand Qped ab set- added alt UE lift L stretch   PATIENT EDUCATION:  Education details: Discussed eval findings, rehab rationale and POC and patient is in agreement Person educated: Patient Education method: Explanation Education comprehension: verbalized understanding   HOME EXERCISE PROGRAM: [id number]  ASSESSMENT:  CLINICAL IMPRESSION: Progressed core strengthening. She tolerated without difficulty with some slight discomfort in knees.  Overall pain decreased. Cuing as well as demonstration for execution needed. She will be land based next visit for continued work on [employer] using glutes rather than lumbar extensors to bring body upright.      OBJECTIVE IMPAIRMENTS decreased activity tolerance, decreased mobility, difficulty walking, decreased  strength, and obesity.   ACTIVITY LIMITATIONS carrying, lifting, bending, sleeping, stairs, caring for others, and STS transfers  PARTICIPATION LIMITATIONS: cleaning, laundry, community activity, occupation, and lifting twin  32 month old children  PERSONAL FACTORS Age, Behavior pattern, and Fitness are also affecting patient's functional outcome.    GOALS: Goals reviewed with patient? Yes  SHORT TERM GOALS: Target date: 05/30/22  Pt will be able to rise to standing position with pain <3/10 Baseline: Goal status: Ongoing   2.  Pt will be indep with proper body mechanics when lifting babies Baseline: poor body mechanics- eval Goal status: Ongoing   3.  Pt will improve on 5x STS to less than 12s Baseline: 17.89 Goal status: ongoing  4.  Pt will report max pain in back and shoulders to be <4/10 Baseline: 7/10 Goal status: Ongoing   LONG TERM GOALS: Target date: 08/09/22  Foto goal to be met 55% Baseline: 44% Goal status: onging  2.  Pt will be able to get on and off of floor to interact with babies Baseline: can get on/off floor, but has increased L knee pain Goal status: Partially met -06/12/22  3.  Pt will complete stair climbing using reciprocal pattern Baseline: step to  /step through depending on Lt knee pain level  Goal status:Partially met -06/12/22  4.  Pt will complete STS transfers without pain Baseline: 7/10 Goal status: INITIAL  5.  Pt to follow up with [employer] for membership to have pool/gym access to further weight loss journey Baseline: no membership Goal status: ongoing   PLAN: PT FREQUENCY: 1-2x/week  PT DURATION: 8  PLANNED INTERVENTIONS: Therapeutic exercises, Therapeutic activity, Neuromuscular re-education, Balance training, Gait training, Patient/Family education, Self Care, Joint mobilization, Stair training, Aquatic Therapy, Dry Needling, Cryotherapy, Moist heat, Taping, Manual therapy, and Re-evaluation.  PLAN FOR NEXT SESSION: begin transitioning onto land based therapy for cont body mechanic instruction and good HEP for stretching and strengthening. Aquatics for progression of strengthening and final HEP  St[facility] Tharon Aquas) Evanee Lubrano MPT 07/02/22 [facility] [address] [place], [place], [address] Phone: [phone]   Fax:  [phone]

## 2022-07-02 ENCOUNTER — Encounter (HOSPITAL_BASED_OUTPATIENT_CLINIC_OR_DEPARTMENT_OTHER): Payer: Self-pay | Admitting: Physical Therapy

## 2022-07-02 ENCOUNTER — Encounter (HOSPITAL_BASED_OUTPATIENT_CLINIC_OR_DEPARTMENT_OTHER): Payer: BC Managed Care – PPO | Admitting: Physical Therapy

## 2022-07-02 ENCOUNTER — Ambulatory Visit (HOSPITAL_BASED_OUTPATIENT_CLINIC_OR_DEPARTMENT_OTHER): Payer: BC Managed Care – PPO | Admitting: Physical Therapy

## 2022-07-02 DIAGNOSIS — M6281 Muscle weakness (generalized): Secondary | ICD-10-CM | POA: Diagnosis not present

## 2022-07-02 DIAGNOSIS — M5459 Other low back pain: Secondary | ICD-10-CM

## 2022-07-03 DIAGNOSIS — F331 Major depressive disorder, recurrent, moderate: Secondary | ICD-10-CM | POA: Diagnosis not present

## 2022-07-03 DIAGNOSIS — F411 Generalized anxiety disorder: Secondary | ICD-10-CM | POA: Diagnosis not present

## 2022-07-05 ENCOUNTER — Ambulatory Visit (HOSPITAL_BASED_OUTPATIENT_CLINIC_OR_DEPARTMENT_OTHER): Payer: BC Managed Care – PPO | Admitting: Physical Therapy

## 2022-07-05 ENCOUNTER — Encounter (HOSPITAL_BASED_OUTPATIENT_CLINIC_OR_DEPARTMENT_OTHER): Payer: Self-pay | Admitting: Physical Therapy

## 2022-07-05 DIAGNOSIS — M6281 Muscle weakness (generalized): Secondary | ICD-10-CM | POA: Diagnosis not present

## 2022-07-05 DIAGNOSIS — M5459 Other low back pain: Secondary | ICD-10-CM | POA: Diagnosis not present

## 2022-07-05 NOTE — Therapy (Signed)
OUTPATIENT PHYSICAL THERAPY THORACOLUMBAR TREATMENT   Patient Name: Kelly Sheppard MRN: [id number] DOB:[birth year], 36 y.o., female Today's Date: 07/05/2022     Past Medical History:  Diagnosis Date   Essential hypertension 12/11/2021   Hypertension    No past surgical history on file. Patient Active Problem List   Diagnosis Date Noted   Essential hypertension 12/11/2021    PCP: Kerin Perna, NP  REFERRING PROVIDER: Kerin Perna, NP  REFERRING DIAG:  E66.01 (ICD-10-CM) - Morbid obesity ([place])  M54.9 (ICD-10-CM) - Multilevel spine pain    Rationale for Evaluation and Treatment Rehabilitation  THERAPY DIAG:  No diagnosis found.  ONSET DATE: Jan 2023  SUBJECTIVE:                                                                                                                                                                                           SUBJECTIVE STATEMENT: Pt reports minimal pain in her lower back with transfers. She states that's that her knees only hurt intermittently.   PERTINENT HISTORY:  HTN; Morbid obesity  PAIN:  Are you having pain? Yes: NPRS scale: Current 0/10  Pain location: L knee, medial Pain description: ache Aggravating factors: sit to stand transfer; walking >30 mins and stair climbing Relieving factors: sitting, resting, lying down  LB:2/10   PRECAUTIONS: Other: obesity , c-section 6 mo ago  WEIGHT BEARING RESTRICTIONS No  FALLS:  Has patient fallen in last 6 months? 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  PLOF: Independent  PATIENT GOALS return to normal mobility, be able to get off the floor, reduce pain   OBJECTIVE: * Findings taken at evaluation unless otherwise stated.     PATIENT SURVEYS:  FOTO 44 with  goal of 55 06/14/22 38    COGNITION:  Overall cognitive status: Within functional limits for tasks assessed     SENSATION: Left groin pain burning intermittent pt reports from c-section a few times a week    POSTURE:  appears wfl although due to pt body habitus difficult to assess  PALPATION: Slight TTP upper traps and cervical to mid thoracic paraspinals   LUMBAR ROM:  WFL no pain   LOWER EXTREMITY ROM:     WFL  LOWER EXTREMITY MMT:    MMT Right eval Left eval Right / Left 06/14/22  Hip flexion 4 4 4-/5 p!  Hip extension     Hip abduction 5 5   Hip adduction 5 5  Hip internal rotation     Hip external rotation     Knee flexion 5 5   Knee extension 5 5   Ankle dorsiflexion     Ankle plantarflexion     Ankle inversion     Ankle eversion      (Blank rows = not tested)  Shoulder ROM wfl with pain scapular retraction and depression    FUNCTIONAL TESTS:  5 times sit to stand: 17.89 pain with initial rep, continues throughout but less with last Timed up and go (TUG): 13.22  11/9: 15.17 5 X STS         TUG: 12.40  GAIT: Distance walked: 500 Assistive device utilized: None Level of assistance: Complete Independence Comments: increased lateral deviation    TODAY'S TREATMENT  Treatment                            07/05/22:  NuStep lvl 3, 5 min Abdominal progression: post pelvic tilt, march, alt ext, alt UE flexion Clams supine GTB  Double leg lower from 90 hip flexion, LE turned out Dead lift with 15lb kettle bell- glut set to stand Qped ab set- added alt UE lift LTR   Treatment                            07/02/22:  Pt seen for aquatic therapy today.  Treatment took place in water 3.25-4.75 ft in depth at the [place] [facility]. Temp of water was 92.  Pt entered/exited the pool via stairs independently with bilat rail.   Without support:  walking forward, backwards walking. Forward backward amb with yellow hand buoys submerged at  sides Retro step ups bottom step Side step up R/L 2 x 10 Standing ue supported on wall hip extension with glute set at end range Kick board row staggered LE then together ea 2x15reps Kick board ptress  TrA set:7 x 10s hold submerged to arm pit Resisted core rotation for rotator strengthening R/L x10    Pt requires the buoyancy and hydrostatic pressure of water for support, and to offload joints by unweighting joint load by at least 50 % in navel deep water and by at least 75-80% in chest to neck deep water.  Viscosity of the water is needed for resistance of strengthening. Water current perturbations provides challenge to standing balance requiring increased core activation. Treatment                            06/27/22:  Abdominal progression: post pelvic tilt, march, alt ext, alt UE flexion Double leg lower from 90 hip flexion, LE turned out Dead lift with 15lb kettle bell- glut set to stand Qped ab set- added alt UE lift L stretch   PATIENT EDUCATION:  Education details: Discussed eval findings, rehab rationale and POC and patient is in agreement Person educated: Patient Education method: Explanation Education comprehension: verbalized understanding   HOME EXERCISE PROGRAM: [id number]  ASSESSMENT:  CLINICAL IMPRESSION: Continued with progressions made last session with intermittent cues for proper forum. She reports improvements in her symptoms with exercises today. Pt encouraged to continue with movement at home to help reduce stiffness with transfers. Discussed healing duration post partum and activity with twins at home. Plan to discuss modifications needed to assist twins if needed. Pt will continue to benefit from skilled PT to address continued deficits.  OBJECTIVE IMPAIRMENTS decreased activity tolerance, decreased mobility, difficulty walking, decreased strength, and obesity.   ACTIVITY LIMITATIONS carrying, lifting, bending, sleeping, stairs, caring for others, and  STS transfers  PARTICIPATION LIMITATIONS: cleaning, laundry, community activity, occupation, and lifting twin 36 month old children  PERSONAL FACTORS Age, Behavior pattern, and Fitness are also affecting patient's functional outcome.    GOALS: Goals reviewed with patient? Yes  SHORT TERM GOALS: Target date: 05/30/22  Pt will be able to rise to standing position with pain <3/10 Baseline: Goal status: Ongoing   2.  Pt will be indep with proper body mechanics when lifting babies Baseline: poor body mechanics- eval Goal status: Ongoing   3.  Pt will improve on 5x STS to less than 12s Baseline: 17.89 Goal status: ongoing  4.  Pt will report max pain in back and shoulders to be <4/10 Baseline: 7/10 Goal status: Ongoing   LONG TERM GOALS: Target date: 08/09/22  Foto goal to be met 55% Baseline: 44% Goal status: onging  2.  Pt will be able to get on and off of floor to interact with babies Baseline: can get on/off floor, but has increased L knee pain Goal status: Partially met -06/12/22  3.  Pt will complete stair climbing using reciprocal pattern Baseline: step to  /step through depending on Lt knee pain level  Goal status:Partially met -06/12/22  4.  Pt will complete STS transfers without pain Baseline: 7/10 Goal status: INITIAL  5.  Pt to follow up with [employer] for membership to have pool/gym access to further weight loss journey Baseline: no membership Goal status: ongoing   PLAN: PT FREQUENCY: 1-2x/week  PT DURATION: 8  PLANNED INTERVENTIONS: Therapeutic exercises, Therapeutic activity, Neuromuscular re-education, Balance training, Gait training, Patient/Family education, Self Care, Joint mobilization, Stair training, Aquatic Therapy, Dry Needling, Cryotherapy, Moist heat, Taping, Manual therapy, and Re-evaluation.  PLAN FOR NEXT SESSION: begin transitioning onto land based therapy for cont body mechanic instruction and good HEP for stretching and strengthening.  Aquatics for progression of strengthening and final HEP  Rudi Heap PT, DPT 07/05/22  8:58 AM

## 2022-07-10 ENCOUNTER — Encounter (HOSPITAL_BASED_OUTPATIENT_CLINIC_OR_DEPARTMENT_OTHER): Payer: Self-pay | Admitting: Physical Therapy

## 2022-07-10 ENCOUNTER — Ambulatory Visit (HOSPITAL_BASED_OUTPATIENT_CLINIC_OR_DEPARTMENT_OTHER): Payer: BC Managed Care – PPO | Attending: Primary Care | Admitting: Physical Therapy

## 2022-07-10 DIAGNOSIS — M5459 Other low back pain: Secondary | ICD-10-CM

## 2022-07-10 DIAGNOSIS — M6281 Muscle weakness (generalized): Secondary | ICD-10-CM

## 2022-07-10 DIAGNOSIS — F411 Generalized anxiety disorder: Secondary | ICD-10-CM | POA: Diagnosis not present

## 2022-07-10 NOTE — Therapy (Signed)
OUTPATIENT PHYSICAL THERAPY THORACOLUMBAR TREATMENT   Patient Name: Kelly Sheppard MRN: [id number] DOB:[birth year], 36 y.o., female Today's Date: 07/10/2022   PT End of Session - 07/10/22 0947     Visit Number 11    Number of Visits 23    Date for PT Re-Evaluation 08/09/22    Authorization Type BCBS    PT Start Time [phone]   pt did not sign in/PT did not know she was here   PT Stop Time 1013    PT Time Calculation (min) 36 min    Activity Tolerance Patient tolerated treatment well    Behavior During Therapy [facility] for tasks assessed/performed              Past Medical History:  Diagnosis Date   Essential hypertension 12/11/2021   Hypertension    History reviewed. No pertinent surgical history. Patient Active Problem List   Diagnosis Date Noted   Essential hypertension 12/11/2021    PCP: Kerin Perna, NP  REFERRING PROVIDER: Kerin Perna, NP  REFERRING DIAG:  E66.01 (ICD-10-CM) - Morbid obesity ([place])  M54.9 (ICD-10-CM) - Multilevel spine pain    Rationale for Evaluation and Treatment Rehabilitation  THERAPY DIAG:  Other low back pain  Muscle weakness (generalized)  ONSET DATE: Jan 2023  SUBJECTIVE:                                                                                                                                                                                           SUBJECTIVE STATEMENT:  Oh I didn't know I needed to sign in usually I just sit and they come get me. My back feels worse when I'm transitioning but knees are still sore. The water therapy and stretching has helped the most. The stretch on the bar helps the most.   PERTINENT HISTORY:  HTN; Morbid obesity  PAIN:  Are you having pain? Yes: NPRS scale: Current 2/10 L knee, back 3/10 when I'm moving  Pain location: L knee, medial and low back when moving  Pain description: ache in knee, catch in back with transitions  Aggravating factors: sit to stand transfer; walking >30  mins and stair climbing Relieving factors: sitting, resting, lying down, just not changing positions      PRECAUTIONS: Other: obesity , c-section 6 mo ago  WEIGHT BEARING RESTRICTIONS No  FALLS:  Has patient fallen in last 6 months? 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  PLOF: Independent  PATIENT GOALS return to normal mobility, be able to get off the floor, reduce pain   OBJECTIVE: * Findings taken at evaluation unless otherwise stated.     PATIENT SURVEYS:  FOTO 44 with goal of 55 06/14/22 38    COGNITION:  Overall cognitive status: Within functional limits for tasks assessed     SENSATION: Left groin pain burning intermittent pt reports from c-section a few times a week    POSTURE:  appears wfl although due to pt body habitus difficult to assess  PALPATION: Slight TTP upper traps and cervical to mid thoracic paraspinals   LUMBAR ROM:  WFL no pain   LOWER EXTREMITY ROM:     WFL  LOWER EXTREMITY MMT:    MMT Right eval Left eval Right / Left 06/14/22 12/5 R 12/5 L   Hip flexion 4 4 4-/5 p! 4 4  Hip extension       Hip abduction 5 5     Hip adduction 5 5     Hip internal rotation       Hip external rotation       Knee flexion 5 5     Knee extension 5 5     Ankle dorsiflexion       Ankle plantarflexion       Ankle inversion       Ankle eversion        (Blank rows = not tested)  Shoulder ROM wfl with pain scapular retraction and depression    FUNCTIONAL TESTS:  5 times sit to stand: 17.89 pain with initial rep, continues throughout but less with last Timed up and go (TUG): 13.22  11/9: 15.17 5 X STS         TUG: 12.40  12/05 STS12.65 seconds, TUG 10 seconds   GAIT: Distance walked: 500 Assistive device utilized: None Level of  assistance: Complete Independence Comments: increased lateral deviation    TODAY'S TREATMENT    07/10/22  TherEx  PPT x15 with 5 second holds  PPT x10 B with march PPT with SLR x10 B Supine clams with green TB x20 and 3 second holds Sidelying hip ABD x12 B L stretch at bar 3x30 seconds   Objective measures + goal review + education        Treatment                            07/05/22:  NuStep lvl 3, 5 min Abdominal progression: post pelvic tilt, march, alt ext, alt UE flexion Clams supine GTB  Double leg lower from 90 hip flexion, LE turned out Dead lift with 15lb kettle bell- glut set to stand Qped ab set- added alt UE lift LTR   Treatment                            07/02/22:  Pt seen for aquatic therapy today.  Treatment took place in water 3.25-4.75 ft in depth at the [facility]. Temp of water was 92.  Pt entered/exited the pool via stairs independently with bilat rail.   Without support:  walking forward, backwards walking. Forward backward amb with yellow hand buoys submerged at sides Retro step ups bottom step Side step up R/L 2 x 10 Standing ue supported on wall hip extension with glute set at end range Kick board row staggered LE then together ea 2x15reps Kick board ptress  TrA set:7  x 10s hold submerged to arm pit Resisted core rotation for rotator strengthening R/L x10    Pt requires the buoyancy and hydrostatic pressure of water for support, and to offload joints by unweighting joint load by at least 50 % in navel deep water and by at least 75-80% in chest to neck deep water.  Viscosity of the water is needed for resistance of strengthening. Water current perturbations provides challenge to standing balance requiring increased core activation. Treatment                            06/27/22:  Abdominal progression: post pelvic tilt, march, alt ext, alt UE flexion Double leg lower from 90 hip flexion, LE turned out Dead lift with 15lb  kettle bell- glut set to stand Qped ab set- added alt UE lift L stretch   PATIENT EDUCATION:  Education details: Discussed eval findings, rehab rationale and POC and patient is in agreement Person educated: Patient Education method: Explanation Education comprehension: verbalized understanding   HOME EXERCISE PROGRAM:  Access Code: [email or web address] URL: [email or web address] Date: 07/10/2022 Prepared by: Deniece Ree  Exercises - Supine Posterior Pelvic Tilt  - 2 x daily - 7 x weekly - 1 sets - 5 reps - 5s hold - Supine Double Straight Leg Raise  - 2 x daily - 7 x weekly - 5 sets - 5 reps - Quadruped Alternating Arm Lift  - 2 x daily - 7 x weekly - 5 sets - 5 reps - Supine Lower Trunk Rotation  - 2 x daily - 7 x weekly - 1 sets - 10 reps - 5 hold - Seated Quadratus Lumborum Stretch with Arm Overhead  - 1 x daily - 7 x weekly - 3 sets - 10 reps - Seated Quadratus Lumborum Stretch with Forward Bend  - 1 x daily - 7 x weekly - 3 sets - 10 reps - Supine March with Posterior Pelvic Tilt  - 1 x daily - 7 x weekly - 3 sets - 10 reps  ASSESSMENT:  CLINICAL IMPRESSION:  Started session late as patient did not sign in/PT did not know she was here; when education was provided, pt stated "oh I just found out I needed to sign in with the lady up front from that other man" but was still sitting in waiting room when PT came to see if she was here. Focused session on core strengthening and hip strength, also worked to find a version of QL stretch she can reasonably do at home since this seems to be helping quite a bit too. Will continue to progress as able but cannot r/o that body habitus is contributing to joint pain. Got some updated objectives and checked goals today too, still making steady progress and tolerating PT well, continue with POC.    OBJECTIVE IMPAIRMENTS decreased activity tolerance, decreased mobility, difficulty walking, decreased strength, and obesity.   ACTIVITY  LIMITATIONS carrying, lifting, bending, sleeping, stairs, caring for others, and STS transfers  PARTICIPATION LIMITATIONS: cleaning, laundry, community activity, occupation, and lifting twin 44 month old children  PERSONAL FACTORS Age, Behavior pattern, and Fitness are also affecting patient's functional outcome.    GOALS: Goals reviewed with patient? Yes  SHORT TERM GOALS: Target date: 05/30/22  Pt will be able to rise to standing position with pain <3/10 Baseline: Goal status: 12/5- ONGOING, pain 4/10  2.  Pt will be indep with proper body mechanics when lifting babies Baseline: poor body  mechanics- eval Goal status: 12/5- ONGOING "going better, trying not to turn with them as much", improving   3.  Pt will improve on 5x STS to less than 12s Baseline: 17.89 Goal status: ongoing  4.  Pt will report max pain in back and shoulders to be <4/10 Baseline: 7/10 Goal status: 12/5- ONGOING, pain in back is with transitions but quick movements can still cause pain more than 4/10   LONG TERM GOALS: Target date: 08/09/22  Foto goal to be met 55% Baseline: 44% Goal status: onging  2.  Pt will be able to get on and off of floor to interact with babies Baseline: can get on/off floor, but has increased L knee pain Goal status: Partially met -06/12/22  3.  Pt will complete stair climbing using reciprocal pattern Baseline: step to  /step through depending on Lt knee pain level  Goal status:Partially met -06/12/22  4.  Pt will complete STS transfers without pain Baseline: 7/10 Goal status: INITIAL  5.  Pt to follow up with [employer] for membership to have pool/gym access to further weight loss journey Baseline: no membership Goal status: ongoing   PLAN: PT FREQUENCY: 1-2x/week  PT DURATION: 8  PLANNED INTERVENTIONS: Therapeutic exercises, Therapeutic activity, Neuromuscular re-education, Balance training, Gait training, Patient/Family education, Self Care, Joint mobilization, Stair  training, Aquatic Therapy, Dry Needling, Cryotherapy, Moist heat, Taping, Manual therapy, and Re-evaluation.  PLAN FOR NEXT SESSION: begin transitioning onto land based therapy for cont body mechanic instruction and good HEP for stretching and strengthening. Aquatics for progression of strengthening and final HEP   Liliane Mallis U PT DPT PN2  07/10/2022, 10:13 AM

## 2022-07-12 ENCOUNTER — Encounter (HOSPITAL_BASED_OUTPATIENT_CLINIC_OR_DEPARTMENT_OTHER): Payer: BC Managed Care – PPO | Admitting: Physical Therapy

## 2022-07-13 ENCOUNTER — Ambulatory Visit (HOSPITAL_BASED_OUTPATIENT_CLINIC_OR_DEPARTMENT_OTHER): Payer: BC Managed Care – PPO | Admitting: Physical Therapy

## 2022-07-17 ENCOUNTER — Encounter (HOSPITAL_BASED_OUTPATIENT_CLINIC_OR_DEPARTMENT_OTHER): Payer: BC Managed Care – PPO | Admitting: Physical Therapy

## 2022-07-17 DIAGNOSIS — F331 Major depressive disorder, recurrent, moderate: Secondary | ICD-10-CM | POA: Diagnosis not present

## 2022-07-17 DIAGNOSIS — F411 Generalized anxiety disorder: Secondary | ICD-10-CM | POA: Diagnosis not present

## 2022-07-19 ENCOUNTER — Ambulatory Visit (HOSPITAL_BASED_OUTPATIENT_CLINIC_OR_DEPARTMENT_OTHER): Payer: BC Managed Care – PPO | Admitting: Physical Therapy

## 2022-07-19 ENCOUNTER — Encounter (HOSPITAL_BASED_OUTPATIENT_CLINIC_OR_DEPARTMENT_OTHER): Payer: Self-pay | Admitting: Physical Therapy

## 2022-07-19 DIAGNOSIS — M6281 Muscle weakness (generalized): Secondary | ICD-10-CM | POA: Diagnosis not present

## 2022-07-19 DIAGNOSIS — M5459 Other low back pain: Secondary | ICD-10-CM | POA: Diagnosis not present

## 2022-07-19 NOTE — Therapy (Signed)
OUTPATIENT PHYSICAL THERAPY THORACOLUMBAR TREATMENT   Patient Name: Kelly Sheppard MRN: [id number] DOB:[birth year], 36 y.o., female Today's Date: 07/19/2022   PT End of Session - 07/19/22 0958     Visit Number 12    Number of Visits 23    Date for PT Re-Evaluation 08/09/22    Authorization Type BCBS    PT Start Time 0950    PT Stop Time 1030    PT Time Calculation (min) 40 min    Activity Tolerance Patient tolerated treatment well    Behavior During Therapy [facility] for tasks assessed/performed              Past Medical History:  Diagnosis Date   Essential hypertension 12/11/2021   Hypertension    History reviewed. No pertinent surgical history. Patient Active Problem List   Diagnosis Date Noted   Essential hypertension 12/11/2021    PCP: Kerin Perna, NP  REFERRING PROVIDER: Kerin Perna, NP  REFERRING DIAG:  E66.01 (ICD-10-CM) - Morbid obesity ([place])  M54.9 (ICD-10-CM) - Multilevel spine pain    Rationale for Evaluation and Treatment Rehabilitation  THERAPY DIAG:  Other low back pain  Muscle weakness (generalized)  ONSET DATE: Jan 2023  SUBJECTIVE:                                                                                                                                                                                           SUBJECTIVE STATEMENT:  Felt a little twinge in my back when I put my pants on today but it went away  PERTINENT HISTORY:  HTN; Morbid obesity  PAIN:  Are you having pain? Yes: NPRS scale: Current 1/10 L knee, back 2/10 when I'm moving  Pain location: L knee, medial and low back when moving  Pain description: ache in knee, catch in back with transitions  Aggravating factors: sit to stand transfer; walking >30 mins and stair climbing Relieving factors: sitting, resting, lying down, just not changing positions      PRECAUTIONS: Other: obesity , c-section 6 mo ago  WEIGHT BEARING RESTRICTIONS No  FALLS:  Has  patient fallen in last 6 months? 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  PLOF: Independent  PATIENT GOALS return to normal mobility, be able to get off the floor, reduce pain   OBJECTIVE: * Findings taken at evaluation unless otherwise stated.     PATIENT SURVEYS:  FOTO 44 with goal of 55 06/14/22 38  COGNITION:  Overall cognitive status: Within functional limits for tasks assessed     SENSATION: Left groin pain burning intermittent pt reports from c-section a few times a week    POSTURE:  appears wfl although due to pt body habitus difficult to assess  PALPATION: Slight TTP upper traps and cervical to mid thoracic paraspinals   LUMBAR ROM:  WFL no pain   LOWER EXTREMITY ROM:     WFL  LOWER EXTREMITY MMT:    MMT Right eval Left eval Right / Left 06/14/22 12/5 R 12/5 L   Hip flexion 4 4 4-/5 p! 4 4  Hip extension       Hip abduction 5 5     Hip adduction 5 5     Hip internal rotation       Hip external rotation       Knee flexion 5 5     Knee extension 5 5     Ankle dorsiflexion       Ankle plantarflexion       Ankle inversion       Ankle eversion        (Blank rows = not tested)  Shoulder ROM wfl with pain scapular retraction and depression    FUNCTIONAL TESTS:  5 times sit to stand: 17.89 pain with initial rep, continues throughout but less with last Timed up and go (TUG): 13.22  11/9: 15.17 5 X STS         TUG: 12.40  12/05 STS12.65 seconds, TUG 10 seconds   GAIT: Distance walked: 500 Assistive device utilized: None Level of assistance: Complete Independence Comments: increased lateral deviation    TODAY'S TREATMENT    Treatment                            07/19/22:  Pt seen for aquatic therapy today.  Treatment took place in water  3.25-4.75 ft in depth at the [facility]. Temp of water was 92.  Pt entered/exited the pool via stairs independently with bilat rail.   Without support:  walking forward, backwards walking. Forward backward amb with yellow hand buoys submerged at sides cues for increased speed/jogging QL stretch standing in 3 ft L stretch added tail wagging Core strengthening: using hand bells oscillation in sagittal and frontal planes le staggered then wide stance 2-3 sets 15s fast/slow intervals Leg press down using blue sm squoodle: R/L 2 sets of quick/slow 15s intervals    Pt requires the buoyancy and hydrostatic pressure of water for support, and to offload joints by unweighting joint load by at least 50 % in navel deep water and by at least 75-80% in chest to neck deep water.  Viscosity of the water is needed for resistance of strengthening. Water current perturbations provides challenge to standing balance requiring increased core activation.    07/10/22  TherEx  PPT x15 with 5 second holds  PPT x10 B with march PPT with SLR x10 B Supine clams with green TB x20 and 3 second holds Sidelying hip ABD x12 B L stretch at bar 3x30 seconds   Objective measures + goal review + education        Treatment                            07/05/22:  NuStep lvl 3, 5 min Abdominal progression: post pelvic tilt, march, alt ext, alt UE flexion  Clams supine GTB  Double leg lower from 90 hip flexion, LE turned out Dead lift with 15lb kettle bell- glut set to stand Qped ab set- added alt UE lift LTR   Treatment                            07/02/22:  Pt seen for aquatic therapy today.  Treatment took place in water 3.25-4.75 ft in depth at the [facility]. Temp of water was 92.  Pt entered/exited the pool via stairs independently with bilat rail.   Without support:  walking forward, backwards walking. Forward backward amb with yellow hand buoys submerged at sides Retro step  ups bottom step Side step up R/L 2 x 10 Standing ue supported on wall hip extension with glute set at end range Kick board row staggered LE then together ea 2x15reps Kick board ptress  TrA set:7 x 10s hold submerged to arm pit Resisted core rotation for rotator strengthening R/L x10    Pt requires the buoyancy and hydrostatic pressure of water for support, and to offload joints by unweighting joint load by at least 50 % in navel deep water and by at least 75-80% in chest to neck deep water.  Viscosity of the water is needed for resistance of strengthening. Water current perturbations provides challenge to standing balance requiring increased core activation. Treatment                            06/27/22:  Abdominal progression: post pelvic tilt, march, alt ext, alt UE flexion Double leg lower from 90 hip flexion, LE turned out Dead lift with 15lb kettle bell- glut set to stand Qped ab set- added alt UE lift L stretch   PATIENT EDUCATION:  Education details: Discussed eval findings, rehab rationale and POC and patient is in agreement Person educated: Patient Education method: Explanation Education comprehension: verbalized understanding   HOME EXERCISE PROGRAM:  Access Code: [email or web address] URL: [email or web address] Date: 07/10/2022 Prepared by: Deniece Ree  Exercises - Supine Posterior Pelvic Tilt  - 2 x daily - 7 x weekly - 1 sets - 5 reps - 5s hold - Supine Double Straight Leg Raise  - 2 x daily - 7 x weekly - 5 sets - 5 reps - Quadruped Alternating Arm Lift  - 2 x daily - 7 x weekly - 5 sets - 5 reps - Supine Lower Trunk Rotation  - 2 x daily - 7 x weekly - 1 sets - 10 reps - 5 hold - Seated Quadratus Lumborum Stretch with Arm Overhead  - 1 x daily - 7 x weekly - 3 sets - 10 reps - Seated Quadratus Lumborum Stretch with Forward Bend  - 1 x daily - 7 x weekly - 3 sets - 10 reps - Supine March with Posterior Pelvic Tilt  - 1 x daily - 7 x weekly - 3 sets - 10  reps  ASSESSMENT:  CLINICAL IMPRESSION: Progressed core firing/strengthening and proprioceptive engagement.  She tolerates well requiring moderate vc and demonstration for execution.  Pt able to coordinate new tasks with establishing good motor planning. She reports reduction in pain to 0/10 by end of session. Goals ongoing   OBJECTIVE IMPAIRMENTS decreased activity tolerance, decreased mobility, difficulty walking, decreased strength, and obesity.   ACTIVITY LIMITATIONS carrying, lifting, bending, sleeping, stairs, caring for others, and STS transfers  PARTICIPATION LIMITATIONS: cleaning, laundry, community activity, occupation,  and lifting twin 64 month old children  PERSONAL FACTORS Age, Behavior pattern, and Fitness are also affecting patient's functional outcome.    GOALS: Goals reviewed with patient? Yes  SHORT TERM GOALS: Target date: 05/30/22  Pt will be able to rise to standing position with pain <3/10 Baseline: Goal status: 12/5- ONGOING, pain 4/10  2.  Pt will be indep with proper body mechanics when lifting babies Baseline: poor body mechanics- eval Goal status: 12/5- ONGOING "going better, trying not to turn with them as much", improving   3.  Pt will improve on 5x STS to less than 12s Baseline: 17.89 Goal status: ongoing  4.  Pt will report max pain in back and shoulders to be <4/10 Baseline: 7/10 Goal status: 12/5- ONGOING, pain in back is with transitions but quick movements can still cause pain more than 4/10   LONG TERM GOALS: Target date: 08/09/22  Foto goal to be met 55% Baseline: 44% Goal status: onging  2.  Pt will be able to get on and off of floor to interact with babies Baseline: can get on/off floor, but has increased L knee pain Goal status: Partially met -06/12/22  3.  Pt will complete stair climbing using reciprocal pattern Baseline: step to  /step through depending on Lt knee pain level  Goal status:Partially met -06/12/22  4.  Pt will  complete STS transfers without pain Baseline: 7/10 Goal status: INITIAL  5.  Pt to follow up with [employer] for membership to have pool/gym access to further weight loss journey Baseline: no membership Goal status: ongoing   PLAN: PT FREQUENCY: 1-2x/week  PT DURATION: 8  PLANNED INTERVENTIONS: Therapeutic exercises, Therapeutic activity, Neuromuscular re-education, Balance training, Gait training, Patient/Family education, Self Care, Joint mobilization, Stair training, Aquatic Therapy, Dry Needling, Cryotherapy, Moist heat, Taping, Manual therapy, and Re-evaluation.  PLAN FOR NEXT SESSION: begin transitioning onto land based therapy for cont body mechanic instruction and good HEP for stretching and strengthening. Aquatics for progression of strengthening and final HEP   Stanton Kidney Tharon Aquas) Thornton Dohrmann MPT 07/19/22

## 2022-07-24 ENCOUNTER — Ambulatory Visit (HOSPITAL_BASED_OUTPATIENT_CLINIC_OR_DEPARTMENT_OTHER): Payer: BC Managed Care – PPO | Admitting: Physical Therapy

## 2022-07-24 DIAGNOSIS — F331 Major depressive disorder, recurrent, moderate: Secondary | ICD-10-CM | POA: Diagnosis not present

## 2022-07-26 ENCOUNTER — Ambulatory Visit (HOSPITAL_BASED_OUTPATIENT_CLINIC_OR_DEPARTMENT_OTHER): Payer: BC Managed Care – PPO | Admitting: Physical Therapy

## 2022-07-26 ENCOUNTER — Encounter (HOSPITAL_BASED_OUTPATIENT_CLINIC_OR_DEPARTMENT_OTHER): Payer: Self-pay | Admitting: Physical Therapy

## 2022-07-26 DIAGNOSIS — M5459 Other low back pain: Secondary | ICD-10-CM | POA: Diagnosis not present

## 2022-07-26 DIAGNOSIS — M6281 Muscle weakness (generalized): Secondary | ICD-10-CM

## 2022-07-26 NOTE — Therapy (Signed)
OUTPATIENT PHYSICAL THERAPY THORACOLUMBAR TREATMENT   Patient Name: Kelly Sheppard MRN: [id number] DOB:1986-02-08, 36 y.o., female Today's Date: 07/26/2022   PT End of Session - 07/26/22 1004     Visit Number 13    Number of Visits 23    Date for PT Re-Evaluation 08/09/22    Authorization Type BCBS    PT Start Time 0955    PT Stop Time 1030    PT Time Calculation (min) 35 min    Activity Tolerance Patient tolerated treatment well    Behavior During Therapy [facility] for tasks assessed/performed              Past Medical History:  Diagnosis Date   Essential hypertension 12/11/2021   Hypertension    History reviewed. No pertinent surgical history. Patient Active Problem List   Diagnosis Date Noted   Essential hypertension 12/11/2021    PCP: Kerin Perna, NP  REFERRING PROVIDER: Kerin Perna, NP  REFERRING DIAG:  E66.01 (ICD-10-CM) - Morbid obesity ([place])  M54.9 (ICD-10-CM) - Multilevel spine pain    Rationale for Evaluation and Treatment Rehabilitation  THERAPY DIAG:  Other low back pain  Muscle weakness (generalized)  ONSET DATE: Jan 2023  SUBJECTIVE:                                                                                                                                                                                           SUBJECTIVE STATEMENT:  Back is good,  Left hip a little soar  PERTINENT HISTORY:  HTN; Morbid obesity  PAIN:  Are you having pain? Yes: NPRS scale: Current 1/10 L knee, back 0-1/10 when I'm moving  Pain location: L knee, medial and low back when moving  Pain description: ache in knee, catch in back with transitions  Aggravating factors: sit to stand transfer; walking >30 mins and stair climbing Relieving factors: sitting, resting, lying down, just not changing positions      PRECAUTIONS: Other: obesity , c-section 6 mo ago  WEIGHT BEARING RESTRICTIONS No  FALLS:  Has patient fallen in last 6 months?  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  PLOF: Independent  PATIENT GOALS return to normal mobility, be able to get off the floor, reduce pain   OBJECTIVE: * Findings taken at evaluation unless otherwise stated.     PATIENT SURVEYS:  FOTO 44 with goal of 55 06/14/22 38    COGNITION:  Overall cognitive status: Within functional  limits for tasks assessed     SENSATION: Left groin pain burning intermittent pt reports from c-section a few times a week    POSTURE:  appears wfl although due to pt body habitus difficult to assess  PALPATION: Slight TTP upper traps and cervical to mid thoracic paraspinals   LUMBAR ROM:  WFL no pain   LOWER EXTREMITY ROM:     WFL  LOWER EXTREMITY MMT:    MMT Right eval Left eval Right / Left 06/14/22 12/5 R 12/5 L   Hip flexion 4 4 4-/5 p! 4 4  Hip extension       Hip abduction 5 5     Hip adduction 5 5     Hip internal rotation       Hip external rotation       Knee flexion 5 5     Knee extension 5 5     Ankle dorsiflexion       Ankle plantarflexion       Ankle inversion       Ankle eversion        (Blank rows = not tested)  Shoulder ROM wfl with pain scapular retraction and depression    FUNCTIONAL TESTS:  5 times sit to stand: 17.89 pain with initial rep, continues throughout but less with last Timed up and go (TUG): 13.22  11/9: 15.17 5 X STS         TUG: 12.40  12/05 STS12.65 seconds, TUG 10 seconds   GAIT: Distance walked: 500 Assistive device utilized: None Level of assistance: Complete Independence Comments: increased lateral deviation    TODAY'S TREATMENT    Treatment                            07/26/22:  Pt seen for aquatic therapy today.  Treatment took place in water 3.25-4.75 ft in depth at the  [place]. Temp of water was 92.  Pt entered/exited the pool via stairs independently with bilat rail.   Without support:  walking forward, backwards walking. Forward backward amb with yellow hand buoys submerged at sides cues for increased speed/jogging QL stretch standing in 3 ft L stretch added tail wagging Core strengthening: using hand bells oscillation in sagittal and frontal planes le staggered then wide stance 3 sets 15s fast/slow intervals Leg press down using round noodle: R/L 3 sets of quick/slow 15s intervals -add/abd for pf holding noodle for stretching hamstring and adductor -Hip extension (90 of flex to neutral) Cycling on yellow noodle.    Pt requires the buoyancy and hydrostatic pressure of water for support, and to offload joints by unweighting joint load by at least 50 % in navel deep water and by at least 75-80% in chest to neck deep water.  Viscosity of the water is needed for resistance of strengthening. Water current perturbations provides challenge to standing balance requiring increased core activation.    07/10/22  TherEx  PPT x15 with 5 second holds  PPT x10 B with march PPT with SLR x10 B Supine clams with green TB x20 and 3 second holds Sidelying hip ABD x12 B L stretch at bar 3x30 seconds   Objective measures + goal review + education        Treatment                            07/05/22:  NuStep lvl 3,  5 min Abdominal progression: post pelvic tilt, march, alt ext, alt UE flexion Clams supine GTB  Double leg lower from 90 hip flexion, LE turned out Dead lift with 15lb kettle bell- glut set to stand Qped ab set- added alt UE lift LTR   Treatment                            07/02/22:  Pt seen for aquatic therapy today.  Treatment took place in water 3.25-4.75 ft in depth at the [place]. Temp of water was 92.  Pt entered/exited the pool via stairs independently with bilat rail.   Without support:  walking  forward, backwards walking. Forward backward amb with yellow hand buoys submerged at sides Retro step ups bottom step Side step up R/L 2 x 10 Standing ue supported on wall hip extension with glute set at end range Kick board row staggered LE then together ea 2x15reps Kick board ptress  TrA set:7 x 10s hold submerged to arm pit Resisted core rotation for rotator strengthening R/L x10    Pt requires the buoyancy and hydrostatic pressure of water for support, and to offload joints by unweighting joint load by at least 50 % in navel deep water and by at least 75-80% in chest to neck deep water.  Viscosity of the water is needed for resistance of strengthening. Water current perturbations provides challenge to standing balance requiring increased core activation. Treatment                            06/27/22:  Abdominal progression: post pelvic tilt, march, alt ext, alt UE flexion Double leg lower from 90 hip flexion, LE turned out Dead lift with 15lb kettle bell- glut set to stand Qped ab set- added alt UE lift L stretch   PATIENT EDUCATION:  Education details: Discussed eval findings, rehab rationale and POC and patient is in agreement Person educated: Patient Education method: Explanation Education comprehension: verbalized understanding   HOME EXERCISE PROGRAM:  Access Code: [email or web address] URL: [email or web address] Date: 07/10/2022 Prepared by: Deniece Ree Added aquatics 07/26/22 FZ  Exercises - Supine Posterior Pelvic Tilt  - 2 x daily - 7 x weekly - 1 sets - 5 reps - 5s hold - Supine Double Straight Leg Raise  - 2 x daily - 7 x weekly - 5 sets - 5 reps - Quadruped Alternating Arm Lift  - 2 x daily - 7 x weekly - 5 sets - 5 reps - Supine Lower Trunk Rotation  - 2 x daily - 7 x weekly - 1 sets - 10 reps - 5 hold - Seated Quadratus Lumborum Stretch with Arm Overhead  - 1 x daily - 7 x weekly - 3 sets - 10 reps - Seated Quadratus Lumborum Stretch with Forward Bend  -  1 x daily - 7 x weekly - 3 sets - 10 reps - Supine March with Posterior Pelvic Tilt  - 1 x daily - 7 x weekly - 3 sets - 10 reps  ASSESSMENT:  CLINICAL IMPRESSION: Progressed core engagement with good tolerance from pt.  She has had an overall reduction of LBP to 0-1/10.  Knee under 3 max meeting multiple goals today. 1 land session and aquatic session scheduled.  [facility] will [facility].  She is shown sites to find aquatic equipment as she requested. Will print and laminate aquatic HEP for next  pool session. Probable DC.      OBJECTIVE IMPAIRMENTS decreased activity tolerance, decreased mobility, difficulty walking, decreased strength, and obesity.   ACTIVITY LIMITATIONS carrying, lifting, bending, sleeping, stairs, caring for others, and STS transfers  PARTICIPATION LIMITATIONS: cleaning, laundry, community activity, occupation, and lifting twin 37 month old children  PERSONAL FACTORS Age, Behavior pattern, and Fitness are also affecting patient's functional outcome.    GOALS: Goals reviewed with patient? Yes  SHORT TERM GOALS: Target date: 05/30/22  Pt will be able to rise to standing position with pain <3/10 Baseline: Goal status: 07/26/22 Achieved  2.  Pt will be indep with proper body mechanics when lifting babies Baseline: poor body mechanics- eval Goal status: 12/5- ONGOING "going better, trying not to turn with them as much", improving   3.  Pt will improve on 5x STS to less than 12s Baseline: 17.89 Goal status: ongoing  4.  Pt will report max pain in back and shoulders to be <4/10 Baseline: 7/10 Goal status: 07/26/22 Met   LONG TERM GOALS: Target date: 08/09/22  Foto goal to be met 55% Baseline: 44% Goal status: onging  2.  Pt will be able to get on and off of floor to interact with babies Baseline: can get on/off floor, but has increased L knee pain Goal status: Partially met -06/12/22  3.  Pt will complete stair climbing using reciprocal  pattern Baseline: step to  /step through depending on Lt knee pain level  Goal status:Partially met -06/12/22  4.  Pt will complete STS transfers without pain Baseline: 7/10 Goal status: INITIAL  5.  Pt to follow up with [employer] for membership to have pool/gym access to further weight loss journey Baseline: no membership Goal status: ongoing   PLAN: PT FREQUENCY: 1-2x/week  PT DURATION: 8  PLANNED INTERVENTIONS: Therapeutic exercises, Therapeutic activity, Neuromuscular re-education, Balance training, Gait training, Patient/Family education, Self Care, Joint mobilization, Stair training, Aquatic Therapy, Dry Needling, Cryotherapy, Moist heat, Taping, Manual therapy, and Re-evaluation.  PLAN FOR NEXT SESSION: begin transitioning onto land based therapy for cont body mechanic instruction and good HEP for stretching and strengthening. Aquatics for progression of strengthening and final HEP   Stanton Kidney Tharon Aquas) Naryiah Schley MPT 07/19/22  1205p

## 2022-07-31 ENCOUNTER — Ambulatory Visit (HOSPITAL_BASED_OUTPATIENT_CLINIC_OR_DEPARTMENT_OTHER): Payer: BC Managed Care – PPO | Admitting: Physical Therapy

## 2022-07-31 ENCOUNTER — Encounter (HOSPITAL_BASED_OUTPATIENT_CLINIC_OR_DEPARTMENT_OTHER): Payer: Self-pay | Admitting: Physical Therapy

## 2022-07-31 DIAGNOSIS — M5459 Other low back pain: Secondary | ICD-10-CM

## 2022-07-31 DIAGNOSIS — M6281 Muscle weakness (generalized): Secondary | ICD-10-CM

## 2022-07-31 NOTE — Therapy (Signed)
OUTPATIENT PHYSICAL THERAPY THORACOLUMBAR TREATMENT   Patient Name: Kelly Sheppard MRN: [id number] DOB:1986/05/22, 36 y.o., female Today's Date: 07/31/2022   PT End of Session - 07/31/22 1139     Visit Number 14    Number of Visits 23    Date for PT Re-Evaluation 08/09/22    Authorization Type BCBS    PT Start Time 1104    PT Stop Time 1127   had to leave early for another appointment   PT Time Calculation (min) 23 min    Activity Tolerance Patient tolerated treatment well    Behavior During Therapy [facility] for tasks assessed/performed               Past Medical History:  Diagnosis Date   Essential hypertension 12/11/2021   Hypertension    History reviewed. No pertinent surgical history. Patient Active Problem List   Diagnosis Date Noted   Essential hypertension 12/11/2021    PCP: Kerin Perna, NP  REFERRING PROVIDER: Kerin Perna, NP  REFERRING DIAG:  E66.01 (ICD-10-CM) - Morbid obesity ([place])  M54.9 (ICD-10-CM) - Multilevel spine pain    Rationale for Evaluation and Treatment Rehabilitation  THERAPY DIAG:  Other low back pain  Muscle weakness (generalized)  ONSET DATE: Jan 2023  SUBJECTIVE:                                                                                                                                                                                           SUBJECTIVE STATEMENT:   I need to leave early around 1130 maybe, I have another appointment in [place] at 1145. Hardest thing for me is remembering to not twist when bearing weight. A few days ago my pain was high but I'd been on the floor for like 3 hours but it easing up if I keep up with the stretches    PERTINENT HISTORY:  HTN; Morbid obesity  PAIN:  Are you having pain? Yes: NPRS scale: Current 1/10 low back  Pain location: L side of low back  Pain description: tight  Aggravating factors: being in one spot or being still for too long on the floor  Relieving  factors: stretches      PRECAUTIONS: Other: obesity , c-section 6 mo ago  WEIGHT BEARING RESTRICTIONS No  FALLS:  Has patient fallen in last 6 months? 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  PLOF: Independent  PATIENT GOALS return to normal mobility, be able to  get off the floor, reduce pain   OBJECTIVE: * Findings taken at evaluation unless otherwise stated.     PATIENT SURVEYS:  FOTO 44 with goal of 55 06/14/22 38    COGNITION:  Overall cognitive status: Within functional limits for tasks assessed     SENSATION: Left groin pain burning intermittent pt reports from c-section a few times a week    POSTURE:  appears wfl although due to pt body habitus difficult to assess  PALPATION: Slight TTP upper traps and cervical to mid thoracic paraspinals   LUMBAR ROM:  WFL no pain   LOWER EXTREMITY ROM:     [facility]  LOWER EXTREMITY MMT:    MMT Right eval Left eval Right / Left 06/14/22 12/5 R 12/5 L  12/26  R 12/26 L  Hip flexion 4 4 4-/5 p! 4 4 4+ 4+  Hip extension         Hip abduction _0 Hip adduction 5 5       Hip internal rotation         Hip external rotation         Knee flexion _1 Knee extension _2 Ankle dorsiflexion         Ankle plantarflexion         Ankle inversion         Ankle eversion          (Blank rows = not tested)  Shoulder ROM wfl with pain scapular retraction and depression    FUNCTIONAL TESTS:  5 times sit to stand: 17.89 pain with initial rep, continues throughout but less with last Timed up and go (TUG): 13.22  11/9: 15.17 5 X STS         TUG: 12.40  12/05 STS12.65 seconds, TUG 10 seconds   12/26: 9 seconds GAIT: Distance walked: 500 Assistive device utilized: None Level of assistance: Complete  Independence Comments: increased lateral deviation    TODAY'S TREATMENT   07/31/22  Objective measures/appropriate education focus        Treatment                            07/26/22:  Pt seen for aquatic therapy today.  Treatment took place in water 3.25-4.75 ft in depth at the [place]. Temp of water was 92.  Pt entered/exited the pool via stairs independently with bilat rail.   Without support:  walking forward, backwards walking. Forward backward amb with yellow hand buoys submerged at sides cues for increased speed/jogging QL stretch standing in 3 ft L stretch added tail wagging Core strengthening: using hand bells oscillation in sagittal and frontal planes le staggered then wide stance 3 sets 15s fast/slow intervals Leg press down using round noodle: R/L 3 sets of quick/slow 15s intervals -add/abd for pf holding noodle for stretching hamstring and adductor -Hip extension (90 of flex to neutral) Cycling on yellow noodle.    Pt requires the buoyancy and hydrostatic pressure of water for support, and to offload joints by unweighting joint load by at least 50 % in navel deep water and by at least 75-80% in chest to neck deep water.  Viscosity of the water is needed for resistance of strengthening. Water current perturbations provides challenge to standing balance requiring increased core activation.    07/10/22  TherEx  PPT  x15 with 5 second holds  PPT x10 B with march PPT with SLR x10 B Supine clams with green TB x20 and 3 second holds Sidelying hip ABD x12 B L stretch at bar 3x30 seconds   Objective measures + goal review + education        Treatment                            07/05/22:  NuStep lvl 3, 5 min Abdominal progression: post pelvic tilt, march, alt ext, alt UE flexion Clams supine GTB  Double leg lower from 90 hip flexion, LE turned out Dead lift with 15lb kettle bell- glut set to stand Qped ab set- added alt UE  lift LTR   Treatment                            07/02/22:  Pt seen for aquatic therapy today.  Treatment took place in water 3.25-4.75 ft in depth at the [place]. Temp of water was 92.  Pt entered/exited the pool via stairs independently with bilat rail.   Without support:  walking forward, backwards walking. Forward backward amb with yellow hand buoys submerged at sides Retro step ups bottom step Side step up R/L 2 x 10 Standing ue supported on wall hip extension with glute set at end range Kick board row staggered LE then together ea 2x15reps Kick board ptress  TrA set:7 x 10s hold submerged to arm pit Resisted core rotation for rotator strengthening R/L x10    Pt requires the buoyancy and hydrostatic pressure of water for support, and to offload joints by unweighting joint load by at least 50 % in navel deep water and by at least 75-80% in chest to neck deep water.  Viscosity of the water is needed for resistance of strengthening. Water current perturbations provides challenge to standing balance requiring increased core activation. Treatment                            06/27/22:  Abdominal progression: post pelvic tilt, march, alt ext, alt UE flexion Double leg lower from 90 hip flexion, LE turned out Dead lift with 15lb kettle bell- glut set to stand Qped ab set- added alt UE lift L stretch   PATIENT EDUCATION:  Education details: modified session due to pt's time limitations today (pt in agreement), objective findings/progress towards goals, HEP updates, recommendations for transition to gym (weight machines and free weights with trainer, form), POC next session and DC at that time  Person educated: Patient Education method: Explanation Education comprehension: verbalized understanding   HOME EXERCISE PROGRAM:  Access Code: [email or web address] URL: [email or web address] Date: 07/31/2022 Prepared by: Deniece Ree  Exercises - Supine Posterior  Pelvic Tilt  - 2 x daily - 7 x weekly - 1 sets - 5 reps - 5s hold - Supine Double Straight Leg Raise  - 2 x daily - 7 x weekly - 5 sets - 5 reps - Quadruped Alternating Arm Lift  - 2 x daily - 7 x weekly - 5 sets - 5 reps - Supine Lower Trunk Rotation  - 2 x daily - 7 x weekly - 1 sets - 10 reps - 5 hold - Seated Quadratus Lumborum Stretch with Arm Overhead  - 1 x daily - 7 x weekly - 3 sets - 10 reps - Seated Quadratus Lumborum  Stretch with Forward Bend  - 1 x daily - 7 x weekly - 3 sets - 10 reps - Supine March with Posterior Pelvic Tilt  - 1 x daily - 7 x weekly - 3 sets - 10 reps - Seated Straddle on Noodle Reverse Breast Stroke Arms and Bicycle Legs  - 1 x daily - 7 x weekly - 3 sets - 10 reps - Staggered Stance Scapular Retraction and Depression with Resistance  - 1 x daily - 7 x weekly - 3 sets - 10 reps - Standing 'L' Stretch at Counter  - 1 x daily - 7 x weekly - 3 sets - 10 reps - Standing Hip Circles with Noodle at [employer]  - 1 x daily - 7 x weekly - 3 sets - 10 reps - Side to Side Hamstring Stretch with Noodle at [employer]  - 1 x daily - 7 x weekly - 3 sets - 10 reps - Standing Hip Abduction Adduction at [employer]  - 1 x daily - 7 x weekly - 3 sets - 10 reps - Standing Hip Flexion Extension at [employer]  - 1 x daily - 7 x weekly - 3 sets - 10 reps - Kettlebell Suitcase Carry  - 1 x daily - 7 x weekly - 3 sets - 10 reps - Standing Bilateral Low Shoulder Row with Anchored Resistance  - 1 x daily - 7 x weekly - 3 sets - 10 reps - Kettlebell Deadlift  - 1 x daily - 7 x weekly - 3 sets - 10 reps - Modified Single-Leg Deadlift  - 1 x daily - 7 x weekly - 3 sets - 10 reps - Standing March  - 1 x daily - 7 x weekly - 3 sets - 10 reps  ASSESSMENT:  CLINICAL IMPRESSION:  Darden Dates arrives today doing OK but session was very limited as she had to get to another appointment in [place]. Since she is very close to DC, focused on getting a final set of objective measures and updating goals as  well as updating land based HEP. Will still need water based HEP prior to DC.       OBJECTIVE IMPAIRMENTS decreased activity tolerance, decreased mobility, difficulty walking, decreased strength, and obesity.   ACTIVITY LIMITATIONS carrying, lifting, bending, sleeping, stairs, caring for others, and STS transfers  PARTICIPATION LIMITATIONS: cleaning, laundry, community activity, occupation, and lifting twin 27 month old children  PERSONAL FACTORS Age, Behavior pattern, and Fitness are also affecting patient's functional outcome.    GOALS: Goals reviewed with patient? Yes  SHORT TERM GOALS: Target date: 05/30/22  Pt will be able to rise to standing position with pain <3/10 Baseline: Goal status: 07/26/22 Achieved  2.  Pt will be indep with proper body mechanics when lifting babies Baseline: poor body mechanics- eval Goal status: 12/26- ONGOING "going better, trying not to turn with them as much", improving   3.  Pt will improve on 5x STS to less than 12s Baseline: 17.89 Goal status: 12/26- MET 9 seconds   4.  Pt will report max pain in back and shoulders to be <4/10 Baseline: 7/10 Goal status: 07/26/22 Met   LONG TERM GOALS: Target date: 08/09/22  Foto goal to be met 55% Baseline: 44% Goal status: onging  2.  Pt will be able to get on and off of floor to interact with babies Baseline: can get on/off floor, but has increased L knee pain Goal status: 12/26- PARTIALLY MET can do  it but sometimes hurts   3.  Pt will complete stair climbing using reciprocal pattern Baseline: step to  /step through depending on Lt knee pain level  Goal status: 12/26- PARTIALLY MET still does step to pattern no pain   4.  Pt will complete STS transfers without pain Baseline: 7/10 Goal status: MET 12/26  5.  Pt to follow up with [employer] for membership to have pool/gym access to further weight loss journey Baseline: no membership Goal status: 12/26 MET set up membership    PLAN: PT  FREQUENCY: 1-2x/week  PT DURATION: 8  PLANNED INTERVENTIONS: Therapeutic exercises, Therapeutic activity, Neuromuscular re-education, Balance training, Gait training, Patient/Family education, Self Care, Joint mobilization, Stair training, Aquatic Therapy, Dry Needling, Cryotherapy, Moist heat, Taping, Manual therapy, and Re-evaluation.  PLAN FOR NEXT SESSION: water based HEP, final FOTO, DC (objective measures/goals already done last session)    Timmia Cogburn U PT DPT PN2  07/31/2022, 11:39 AM

## 2022-08-02 ENCOUNTER — Encounter (HOSPITAL_BASED_OUTPATIENT_CLINIC_OR_DEPARTMENT_OTHER): Payer: Self-pay | Admitting: Physical Therapy

## 2022-08-02 ENCOUNTER — Ambulatory Visit (HOSPITAL_BASED_OUTPATIENT_CLINIC_OR_DEPARTMENT_OTHER): Payer: BC Managed Care – PPO | Admitting: Physical Therapy

## 2022-08-02 DIAGNOSIS — M6281 Muscle weakness (generalized): Secondary | ICD-10-CM | POA: Diagnosis not present

## 2022-08-02 DIAGNOSIS — M5459 Other low back pain: Secondary | ICD-10-CM

## 2022-08-02 NOTE — Therapy (Signed)
OUTPATIENT PHYSICAL THERAPY THORACOLUMBAR DC PHYSICAL THERAPY DISCHARGE SUMMARY  Visits from Start of Care: 15  Current functional level related to goals / functional outcomes: Pt safe and indep with all functional mobility and ADL's   Remaining deficits: Occasional pain/stiffness   Education / Equipment: Management of condition; HEP land and aquatic based  Patient agrees to discharge. Patient goals were partially met. Patient is being discharged due to maximized rehab potential.     Patient Name: Kelly Sheppard MRN: [id number] DOB:Jun 12, 1986, 36 y.o., female Today's Date: 08/02/2022   PT End of Session - 08/02/22 1012     Visit Number 15    Number of Visits 23    Date for PT Re-Evaluation 08/09/22    Authorization Type BC[employer]    PT Start Time 1005    PT Stop Time 1050    PT Time Calculation (min) 45 min    Activity Tolerance Patient tolerated treatment well    Behavior During Therapy [facility] for tasks assessed/performed               Past Medical History:  Diagnosis Date   Essential hypertension 12/11/2021   Hypertension    History reviewed. No pertinent surgical history. Patient Active Problem List   Diagnosis Date Noted   Essential hypertension 12/11/2021    PCP: Kerin Perna, NP  REFERRING PROVIDER: Kerin Perna, NP  REFERRING DIAG:  E66.01 (ICD-10-CM) - Morbid obesity ([place])  M54.9 (ICD-10-CM) - Multilevel spine pain    Rationale for Evaluation and Treatment Rehabilitation  THERAPY DIAG:  Other low back pain  Muscle weakness (generalized)  ONSET DATE: Jan 2023  SUBJECTIVE:                                                                                                                                                                                           SUBJECTIVE STATEMENT:  "My back is just tight I need to stretch it."   PERTINENT HISTORY:  HTN; Morbid obesity  PAIN:  Are you having pain? Yes: NPRS scale: Current 0/10 low  back  Pain location: L side of low back  Pain description: tight  Aggravating factors: being in one spot or being still for too long on the floor  Relieving factors: stretches      PRECAUTIONS: Other: obesity , c-section 6 mo ago  WEIGHT BEARING RESTRICTIONS No  FALLS:  Has patient fallen in last 6 months? 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  PLOF: Independent  PATIENT GOALS return to normal mobility, be able to get off the floor, reduce pain   OBJECTIVE: * Findings taken at evaluation unless otherwise stated.     PATIENT SURVEYS:  FOTO 44 with goal of 55 06/14/22 38 08/02/22 DC:    COGNITION:  Overall cognitive status: Within functional limits for tasks assessed     SENSATION: Left groin pain burning intermittent pt reports from c-section a few times a week    POSTURE:  appears wfl although due to pt body habitus difficult to assess  PALPATION: Slight TTP upper traps and cervical to mid thoracic paraspinals   LUMBAR ROM:  WFL no pain   LOWER EXTREMITY ROM:     [facility]  LOWER EXTREMITY MMT:    MMT Right eval Left eval Right / Left 06/14/22 12/5 R 12/5 L  12/26  R 12/26 L  Hip flexion 4 4 4-/5 p! 4 4 4+ 4+  Hip extension         Hip abduction _0 Hip adduction 5 5       Hip internal rotation         Hip external rotation         Knee flexion _1 Knee extension _2 Ankle dorsiflexion         Ankle plantarflexion         Ankle inversion         Ankle eversion          (Blank rows = not tested)  Shoulder ROM wfl with pain scapular retraction and depression    FUNCTIONAL TESTS:  5 times sit to stand: 17.89 pain with initial rep, continues throughout but less with last Timed up and go (TUG): 13.22  11/9: 15.17 5 X STS          TUG: 12.40  12/05 STS12.65 seconds, TUG 10 seconds   12/26: 9 seconds GAIT: Distance walked: 500 Assistive device utilized: None Level of assistance: Complete Independence Comments: increased lateral deviation    TODAY'S TREATMENT    Treatment                            08/02/22:  Pt seen for aquatic therapy today.  Treatment took place in water 3.25-4.75 ft in depth at the [place]. Temp of water was 92.  Pt entered/exited the pool via stairs independently with bilat rail.   Without support:  walking forward, backwards walking. Forward backward amb with yellow hand buoys submerged at sides cues for increased speed/jogging QL stretch standing in 3 ft L stretch added tail wagging Core strengthening: using hand bells oscillation in sagittal and frontal planes le staggered then wide stance 3 sets 15s fast/slow intervals Leg press down using round noodle: R/L 3 sets of quick/slow 15s intervals -add/abd for pf holding noodle for stretching hamstring and adductor -Hip extension (90 of flex to neutral) Cycling on yellow noodle.    Pt requires the buoyancy and hydrostatic pressure of water for support, and to offload joints by unweighting joint load by at least 50 % in navel deep water and by at least 75-80% in chest to neck deep water.  Viscosity of the water is needed for resistance of strengthening. Water current perturbations provides challenge to standing balance requiring increased core activation.   Final Foto  completed  07/31/22  Objective measures/appropriate education focus        Treatment                            07/26/22:  Pt seen for aquatic therapy today.  Treatment took place in water 3.25-4.75 ft in depth at the [place]. Temp of water was 92.  Pt entered/exited the pool via stairs independently with bilat rail.   Without support:  walking forward, backwards walking. Forward backward amb with yellow hand buoys  submerged at sides cues for increased speed/jogging QL stretch standing in 3 ft L stretch added tail wagging Leg press down using round noodle: R/L 3 sets of quick/slow 15s intervals -add/abd for pf holding noodle for stretching hamstring and adductor -Hip extension (90 of flex to neutral) 2x10 Standing UE support on hand buoys: add/abd; hip flex/ext Kick board row Resisted core rotation using kick board. Cycling on yellow noodle.    Pt requires the buoyancy and hydrostatic pressure of water for support, and to offload joints by unweighting joint load by at least 50 % in navel deep water and by at least 75-80% in chest to neck deep water.  Viscosity of the water is needed for resistance of strengthening. Water current perturbations provides challenge to standing balance requiring increased core activation.    07/10/22  TherEx  PPT x15 with 5 second holds  PPT x10 B with march PPT with SLR x10 B Supine clams with green TB x20 and 3 second holds Sidelying hip ABD x12 B L stretch at bar 3x30 seconds   Objective measures + goal review + education        Treatment                            07/05/22:  NuStep lvl 3, 5 min Abdominal progression: post pelvic tilt, march, alt ext, alt UE flexion Clams supine GTB  Double leg lower from 90 hip flexion, LE turned out Dead lift with 15lb kettle bell- glut set to stand Qped ab set- added alt UE lift LTR   Treatment                            07/02/22:  Pt seen for aquatic therapy today.  Treatment took place in water 3.25-4.75 ft in depth at the [place]. Temp of water was 92.  Pt entered/exited the pool via stairs independently with bilat rail.   Without support:  walking forward, backwards walking. Forward backward amb with yellow hand buoys submerged at sides Retro step ups bottom step Side step up R/L 2 x 10 Standing ue supported on wall hip extension with glute set at end range Kick board row staggered LE  then together ea 2x15reps Kick board ptress  TrA set:7 x 10s hold submerged to arm pit Resisted core rotation for rotator strengthening R/L x10    Pt requires the buoyancy and hydrostatic pressure of water for support, and to offload joints by unweighting joint load by at least 50 % in navel deep water and by at least 75-80% in chest to neck deep water.  Viscosity of the water is needed for resistance of strengthening. Water current perturbations provides challenge to standing balance requiring increased core activation. Treatment  06/27/22:  Abdominal progression: post pelvic tilt, march, alt ext, alt UE flexion Double leg lower from 90 hip flexion, LE turned out Dead lift with 15lb kettle bell- glut set to stand Qped ab set- added alt UE lift L stretch   PATIENT EDUCATION:  Education details: modified session due to pt's time limitations today (pt in agreement), objective findings/progress towards goals, HEP updates, recommendations for transition to gym (weight machines and free weights with trainer, form), POC next session and DC at that time  Person educated: Patient Education method: Explanation Education comprehension: verbalized understanding   HOME EXERCISE PROGRAM:  Access Code: [email or web address] URL: [email or web address] Date: 07/31/2022 Prepared by: Deniece Ree  Exercises - Supine Posterior Pelvic Tilt  - 2 x daily - 7 x weekly - 1 sets - 5 reps - 5s hold - Supine Double Straight Leg Raise  - 2 x daily - 7 x weekly - 5 sets - 5 reps - Quadruped Alternating Arm Lift  - 2 x daily - 7 x weekly - 5 sets - 5 reps - Supine Lower Trunk Rotation  - 2 x daily - 7 x weekly - 1 sets - 10 reps - 5 hold - Seated Quadratus Lumborum Stretch with Arm Overhead  - 1 x daily - 7 x weekly - 3 sets - 10 reps - Seated Quadratus Lumborum Stretch with Forward Bend  - 1 x daily - 7 x weekly - 3 sets - 10 reps - Supine March with Posterior Pelvic Tilt  - 1 x  daily - 7 x weekly - 3 sets - 10 reps - Seated Straddle on Noodle Reverse Breast Stroke Arms and Bicycle Legs  - 1 x daily - 7 x weekly - 3 sets - 10 reps - Staggered Stance Scapular Retraction and Depression with Resistance  - 1 x daily - 7 x weekly - 3 sets - 10 reps - Standing 'L' Stretch at Counter  - 1 x daily - 7 x weekly - 3 sets - 10 reps - Standing Hip Circles with Noodle at [employer]  - 1 x daily - 7 x weekly - 3 sets - 10 reps - Side to Side Hamstring Stretch with Noodle at [employer]  - 1 x daily - 7 x weekly - 3 sets - 10 reps - Standing Hip Abduction Adduction at [employer]  - 1 x daily - 7 x weekly - 3 sets - 10 reps - Standing Hip Flexion Extension at [employer]  - 1 x daily - 7 x weekly - 3 sets - 10 reps - Kettlebell Suitcase Carry  - 1 x daily - 7 x weekly - 3 sets - 10 reps - Standing Bilateral Low Shoulder Row with Anchored Resistance  - 1 x daily - 7 x weekly - 3 sets - 10 reps - Kettlebell Deadlift  - 1 x daily - 7 x weekly - 3 sets - 10 reps - Modified Single-Leg Deadlift  - 1 x daily - 7 x weekly - 3 sets - 10 reps - Standing March  - 1 x daily - 7 x weekly - 3 sets - 10 reps  ASSESSMENT:  CLINICAL IMPRESSION: Pt is directed though session using prepared laminated HEP for guidance.  She is given vc and demonstration to ensure indep with program as this is pt last session. She has progressed well not completely meeting all goals but progressing to safe and indep functional mobility modifying activity and technique where needed.  Pt issued final  aquatic HEP at end of session       OBJECTIVE IMPAIRMENTS decreased activity tolerance, decreased mobility, difficulty walking, decreased strength, and obesity.   ACTIVITY LIMITATIONS carrying, lifting, bending, sleeping, stairs, caring for others, and STS transfers  PARTICIPATION LIMITATIONS: cleaning, laundry, community activity, occupation, and lifting twin 36 month old children  PERSONAL FACTORS Age, Behavior pattern, and  Fitness are also affecting patient's functional outcome.    GOALS: Goals reviewed with patient? Yes  SHORT TERM GOALS: Target date: 05/30/22  Pt will be able to rise to standing position with pain <3/10 Baseline: Goal status: 07/26/22 Achieved  2.  Pt will be indep with proper body mechanics when lifting babies Baseline: poor body mechanics- eval Goal status: 12/26- ONGOING "going better, trying not to turn with them as much", improving   3.  Pt will improve on 5x STS to less than 12s Baseline: 17.89 Goal status: 12/26- MET 9 seconds   4.  Pt will report max pain in back and shoulders to be <4/10 Baseline: 7/10 Goal status: 07/26/22 Met   LONG TERM GOALS: Target date: 08/09/22  Foto goal to be met 55% Baseline: 44% Goal status: not met but improved to 48%  2.  Pt will be able to get on and off of floor to interact with babies Baseline: can get on/off floor, but has increased L knee pain Goal status: 12/26- PARTIALLY MET can do it but sometimes hurts   3.  Pt will complete stair climbing using reciprocal pattern Baseline: step to  /step through depending on Lt knee pain level  Goal status: 12/26- PARTIALLY MET still does step to pattern no pain   4.  Pt will complete STS transfers without pain Baseline: 7/10 Goal status: MET 12/26  5.  Pt to follow up with [employer] for membership to have pool/gym access to further weight loss journey Baseline: no membership Goal status: 12/26 MET set up membership    PLAN: PT FREQUENCY: 1-2x/week  PT DURATION: 8  PLANNED INTERVENTIONS: Therapeutic exercises, Therapeutic activity, Neuromuscular re-education, Balance training, Gait training, Patient/Family education, Self Care, Joint mobilization, Stair training, Aquatic Therapy, Dry Needling, Cryotherapy, Moist heat, Taping, Manual therapy, and Re-evaluation.  PLAN FOR NEXT SESSION: water based HEP, final FOTO, DC (objective measures/goals already done last session)    Stanton Kidney  Tharon Aquas) Falen Lehrmann MPT 08/02/22 1047

## 2022-09-13 DIAGNOSIS — F4310 Post-traumatic stress disorder, unspecified: Secondary | ICD-10-CM | POA: Diagnosis not present

## 2022-09-13 DIAGNOSIS — F411 Generalized anxiety disorder: Secondary | ICD-10-CM | POA: Diagnosis not present

## 2022-09-13 DIAGNOSIS — F331 Major depressive disorder, recurrent, moderate: Secondary | ICD-10-CM | POA: Diagnosis not present

## 2022-10-09 DIAGNOSIS — F411 Generalized anxiety disorder: Secondary | ICD-10-CM | POA: Diagnosis not present

## 2022-10-15 ENCOUNTER — Ambulatory Visit (INDEPENDENT_AMBULATORY_CARE_PROVIDER_SITE_OTHER): Payer: BC Managed Care – PPO | Admitting: Primary Care

## 2022-10-15 ENCOUNTER — Encounter (INDEPENDENT_AMBULATORY_CARE_PROVIDER_SITE_OTHER): Payer: Self-pay | Admitting: Primary Care

## 2022-10-15 DIAGNOSIS — R2242 Localized swelling, mass and lump, left lower limb: Secondary | ICD-10-CM

## 2022-10-15 NOTE — Progress Notes (Signed)
[place], is a 37 y.o. female  [id number]  [id number]  DOB - [birth year]  Chief Complaint  Patient presents with   Foot Swelling    Left foot Big toe nail not growing back        Subjective:   [name] is a 37 y.o. female here today for an acute visit for left foot ankle stays swollen but her big toe and the one beside it bruise- hits it all the time. Patient has No headache, No chest pain, No abdominal pain - No Nausea, No new weakness tingling or numbness, No Cough - shortness of breath  No problems updated.  Allergies  Allergen Reactions   Latex     Past Medical History:  Diagnosis Date   Essential hypertension 12/11/2021   Hypertension     Current Outpatient Medications on File Prior to Visit  Medication Sig Dispense Refill   albuterol (VENTOLIN HFA) 108 (90 Base) MCG/ACT inhaler TAKE 2 PUFFS BY MOUTH EVERY 6 HOURS AS NEEDED FOR WHEEZE OR SHORTNESS OF BREATH 18 each 1   levothyroxine (SYNTHROID) 50 MCG tablet PLEASE SEE ATTACHED FOR DETAILED DIRECTIONS (Patient not taking: Reported on 08/01/2021)     No current facility-administered medications on file prior to visit.    Objective:   Vitals:   10/15/22 1430  BP: (Abnormal) 139/91  Pulse: 83  Resp: 16  SpO2: 100%  Weight: (Abnormal) 353 lb 6.4 oz (160.3 kg)    Comprehensive ROS Pertinent positive and negative noted in HPI   Exam General appearance : Awake, alert, not in any distress. Speech Clear. Not toxic looking HEENT: Atraumatic and Normocephalic, pupils equally reactive to light and accomodation Neck: Supple, no JVD. No cervical lymphadenopathy.  Chest: Good air entry bilaterally, no added sounds  CVS: S1 S2 regular, no murmurs.  Abdomen: Bowel sounds present, Non tender and not distended with no gaurding, rigidity or rebound. Extremities: B/L Lower Ext shows no edema, both legs are warm to touch Neurology: Awake alert, and oriented X 3, CN II-XII  intact, Non focal Skin: No Rash  Data Review No results found for: "HGBA1C"  [place] was seen today for foot swelling.  Diagnoses and all orders for this visit:  Morbid obesity ([place]) Discussed diet and exercise for person with BMI >25. Instructed: You must burn more calories than you eat. Losing 5 percent of your body weight should be considered a success. In the longer term, losing more than 15 percent of your body weight and staying at this weight is an extremely good result. However, keep in mind that even losing 5 percent of your body weight leads to important health benefits, so try not to get discouraged if you're not able to lose more than this. Will recheck weight in 3-6 months.   Localized swelling of left lower leg Advised compression hose Bp border line. Monitor sodium         Patient have been counseled extensively about nutrition and exercise. Other issues discussed during this visit include: low cholesterol diet, weight control and daily exercise, foot care, annual eye examinations at Ophthalmology, importance of adherence with medications and regular follow-up. We also discussed long term complications of uncontrolled diabetes and hypertension.   No follow-ups on file.  The patient was given clear instructions to go to ER or return to medical center if symptoms don't improve, worsen or new problems develop. The patient verbalized understanding. The patient was told to call to  get lab results if they haven't heard anything in the next week.   This note has been created with Surveyor, quantity. Any transcriptional errors are unintentional.   Kerin Perna, NP 10/15/2022, 2:36 PM

## 2022-10-15 NOTE — Patient Instructions (Signed)
Calorie Counting for Weight Loss Calories are units of energy. Your body needs a certain number of calories from food to keep going throughout the day. When you eat or drink more calories than your body needs, your body stores the extra calories mostly as fat. When you eat or drink fewer calories than your body needs, your body burns fat to get the energy it needs. Calorie counting means keeping track of how many calories you eat and drink each day. Calorie counting can be helpful if you need to lose weight. If you eat fewer calories than your body needs, you should lose weight. Ask your health care provider what a healthy weight is for you. For calorie counting to work, you will need to eat the right number of calories each day to lose a healthy amount of weight per week. A dietitian can help you figure out how many calories you need in a day and will suggest ways to reach your calorie goal. A healthy amount of weight to lose each week is usually 1-2 lb (0.5-0.9 kg). This usually means that your daily calorie intake should be reduced by 500-750 calories. Eating 1,200-1,500 calories a day can help most women lose weight. Eating 1,500-1,800 calories a day can help most men lose weight. What do I need to know about calorie counting? Work with your health care provider or dietitian to determine how many calories you should get each day. To meet your daily calorie goal, you will need to: Find out how many calories are in each food that you would like to eat. Try to do this before you eat. Decide how much of the food you plan to eat. Keep a food log. Do this by writing down what you ate and how many calories it had. To successfully lose weight, it is important to balance calorie counting with a healthy lifestyle that includes regular activity. Where do I find calorie information?  The number of calories in a food can be found on a Nutrition Facts label. If a food does not have a Nutrition Facts label, try  to look up the calories online or ask your dietitian for help. Remember that calories are listed per serving. If you choose to have more than one serving of a food, you will have to multiply the calories per serving by the number of servings you plan to eat. For example, the label on a package of bread might say that a serving size is 1 slice and that there are 90 calories in a serving. If you eat 1 slice, you will have eaten 90 calories. If you eat 2 slices, you will have eaten 180 calories. How do I keep a food log? After each time that you eat, record the following in your food log as soon as possible: What you ate. Be sure to include toppings, sauces, and other extras on the food. How much you ate. This can be measured in cups, ounces, or number of items. How many calories were in each food and drink. The total number of calories in the food you ate. Keep your food log near you, such as in a pocket-sized notebook or on an app or website on your mobile phone. Some programs will calculate calories for you and show you how many calories you have left to meet your daily goal. What are some portion-control tips? Know how many calories are in a serving. This will help you know how many servings you can have of a certain   food. Use a measuring cup to measure serving sizes. You could also try weighing out portions on a kitchen scale. With time, you will be able to estimate serving sizes for some foods. Take time to put servings of different foods on your favorite plates or in your favorite bowls and cups so you know what a serving looks like. Try not to eat straight from a food's packaging, such as from a bag or box. Eating straight from the package makes it hard to see how much you are eating and can lead to overeating. Put the amount you would like to eat in a cup or on a plate to make sure you are eating the right portion. Use smaller plates, glasses, and bowls for smaller portions and to prevent  overeating. Try not to multitask. For example, avoid watching TV or using your computer while eating. If it is time to eat, sit down at a table and enjoy your food. This will help you recognize when you are full. It will also help you be more mindful of what and how much you are eating. What are tips for following this plan? Reading food labels Check the calorie count compared with the serving size. The serving size may be smaller than what you are used to eating. Check the source of the calories. Try to choose foods that are high in protein, fiber, and vitamins, and low in saturated fat, trans fat, and sodium. Shopping Read nutrition labels while you shop. This will help you make healthy decisions about which foods to buy. Pay attention to nutrition labels for low-fat or fat-free foods. These foods sometimes have the same number of calories or more calories than the full-fat versions. They also often have added sugar, starch, or salt to make up for flavor that was removed with the fat. Make a grocery list of lower-calorie foods and stick to it. Cooking Try to cook your favorite foods in a healthier way. For example, try baking instead of frying. Use low-fat dairy products. Meal planning Use more fruits and vegetables. One-half of your plate should be fruits and vegetables. Include lean proteins, such as chicken, turkey, and fish. Lifestyle Each week, aim to do one of the following: 150 minutes of moderate exercise, such as walking. 75 minutes of vigorous exercise, such as running. General information Know how many calories are in the foods you eat most often. This will help you calculate calorie counts faster. Find a way of tracking calories that works for you. Get creative. Try different apps or programs if writing down calories does not work for you. What foods should I eat?  Eat nutritious foods. It is better to have a nutritious, high-calorie food, such as an avocado, than a food with  few nutrients, such as a bag of potato chips. Use your calories on foods and drinks that will fill you up and will not leave you hungry soon after eating. Examples of foods that fill you up are nuts and nut butters, vegetables, lean proteins, and high-fiber foods such as whole grains. High-fiber foods are foods with more than 5 g of fiber per serving. Pay attention to calories in drinks. Low-calorie drinks include water and unsweetened drinks. The items listed above may not be a complete list of foods and beverages you can eat. Contact a dietitian for more information. What foods should I limit? Limit foods or drinks that are not good sources of vitamins, minerals, or protein or that are high in unhealthy fats. These   include: Candy. Other sweets. Sodas, specialty coffee drinks, alcohol, and juice. The items listed above may not be a complete list of foods and beverages you should avoid. Contact a dietitian for more information. How do I count calories when eating out? Pay attention to portions. Often, portions are much larger when eating out. Try these tips to keep portions smaller: Consider sharing a meal instead of getting your own. If you get your own meal, eat only half of it. Before you start eating, ask for a container and put half of your meal into it. When available, consider ordering smaller portions from the menu instead of full portions. Pay attention to your food and drink choices. Knowing the way food is cooked and what is included with the meal can help you eat fewer calories. If calories are listed on the menu, choose the lower-calorie options. Choose dishes that include vegetables, fruits, whole grains, low-fat dairy products, and lean proteins. Choose items that are boiled, broiled, grilled, or steamed. Avoid items that are buttered, battered, fried, or served with cream sauce. Items labeled as crispy are usually fried, unless stated otherwise. Choose water, low-fat milk,  unsweetened iced tea, or other drinks without added sugar. If you want an alcoholic beverage, choose a lower-calorie option, such as a glass of wine or light beer. Ask for dressings, sauces, and syrups on the side. These are usually high in calories, so you should limit the amount you eat. If you want a salad, choose a garden salad and ask for grilled meats. Avoid extra toppings such as bacon, cheese, or fried items. Ask for the dressing on the side, or ask for olive oil and vinegar or lemon to use as dressing. Estimate how many servings of a food you are given. Knowing serving sizes will help you be aware of how much food you are eating at restaurants. Where to find more information Centers for Disease Control and Prevention: www.cdc.gov U.[place] Department of Agriculture: myplate.gov Summary Calorie counting means keeping track of how many calories you eat and drink each day. If you eat fewer calories than your body needs, you should lose weight. A healthy amount of weight to lose per week is usually 1-2 lb (0.5-0.9 kg). This usually means reducing your daily calorie intake by 500-750 calories. The number of calories in a food can be found on a Nutrition Facts label. If a food does not have a Nutrition Facts label, try to look up the calories online or ask your dietitian for help. Use smaller plates, glasses, and bowls for smaller portions and to prevent overeating. Use your calories on foods and drinks that will fill you up and not leave you hungry shortly after a meal. This information is not intended to replace advice given to you by your health care provider. Make sure you discuss any questions you have with your health care provider. Document Revised: 09/03/2019 Document Reviewed: 09/03/2019 Elsevier Patient Education  2023 Elsevier Inc.  

## 2022-10-23 DIAGNOSIS — F331 Major depressive disorder, recurrent, moderate: Secondary | ICD-10-CM | POA: Diagnosis not present

## 2022-12-26 DIAGNOSIS — Z0142 Encounter for cervical smear to confirm findings of recent normal smear following initial abnormal smear: Secondary | ICD-10-CM | POA: Diagnosis not present

## 2022-12-26 DIAGNOSIS — Z8742 Personal history of other diseases of the female genital tract: Secondary | ICD-10-CM | POA: Diagnosis not present

## 2022-12-26 DIAGNOSIS — R87612 Low grade squamous intraepithelial lesion on cytologic smear of cervix (LGSIL): Secondary | ICD-10-CM | POA: Diagnosis not present

## 2022-12-26 DIAGNOSIS — R1084 Generalized abdominal pain: Secondary | ICD-10-CM | POA: Diagnosis not present

## 2022-12-26 DIAGNOSIS — Z8619 Personal history of other infectious and parasitic diseases: Secondary | ICD-10-CM | POA: Diagnosis not present

## 2022-12-26 DIAGNOSIS — Z124 Encounter for screening for malignant neoplasm of cervix: Secondary | ICD-10-CM | POA: Diagnosis not present

## 2022-12-26 DIAGNOSIS — Z01419 Encounter for gynecological examination (general) (routine) without abnormal findings: Secondary | ICD-10-CM | POA: Diagnosis not present

## 2022-12-26 DIAGNOSIS — R87616 Satisfactory cervical smear but lacking transformation zone: Secondary | ICD-10-CM | POA: Diagnosis not present

## 2023-01-07 ENCOUNTER — Encounter (INDEPENDENT_AMBULATORY_CARE_PROVIDER_SITE_OTHER): Payer: Self-pay | Admitting: Primary Care

## 2023-01-07 ENCOUNTER — Ambulatory Visit (INDEPENDENT_AMBULATORY_CARE_PROVIDER_SITE_OTHER): Payer: Medicaid Other | Admitting: Primary Care

## 2023-01-07 VITALS — BP 146/93 | HR 125 | Temp 98.7°F | Resp 16

## 2023-01-07 DIAGNOSIS — J029 Acute pharyngitis, unspecified: Secondary | ICD-10-CM | POA: Diagnosis not present

## 2023-01-07 DIAGNOSIS — R051 Acute cough: Secondary | ICD-10-CM

## 2023-01-07 DIAGNOSIS — I10 Essential (primary) hypertension: Secondary | ICD-10-CM

## 2023-01-07 DIAGNOSIS — J4541 Moderate persistent asthma with (acute) exacerbation: Secondary | ICD-10-CM

## 2023-01-09 LAB — SPECIMEN STATUS REPORT

## 2023-01-12 MED ORDER — FLUTICASONE PROPIONATE HFA 44 MCG/ACT IN AERO: 2.0000 | INHALATION_SPRAY | Freq: Two times a day (BID) | RESPIRATORY_TRACT | 3 refills | Status: AC

## 2023-01-12 NOTE — Progress Notes (Signed)
[facility]  Kelly Sheppard, is a 37 y.o. female  [id number]  [id number]  DOB - 30-Nov-1985  Chief Complaint  Patient presents with   URI       Subjective:   Kelly Sheppard is a 37 y.o. female here today for a acute visit Symptoms: low grade Fever, cough, shortness of breath, nasal congestion,  sore throat  No problems updated.  Allergies  Allergen Reactions   Latex     Past Medical History:  Diagnosis Date   Essential hypertension 12/11/2021   Hypertension     Current Outpatient Medications on File Prior to Visit  Medication Sig Dispense Refill   albuterol (VENTOLIN HFA) 108 (90 Base) MCG/ACT inhaler TAKE 2 PUFFS BY MOUTH EVERY 6 HOURS AS NEEDED FOR WHEEZE OR SHORTNESS OF BREATH 18 each 1   levothyroxine (SYNTHROID) 50 MCG tablet PLEASE SEE ATTACHED FOR DETAILED DIRECTIONS (Patient not taking: Reported on 08/01/2021)     No current facility-administered medications on file prior to visit.    Objective:   Vitals:   01/07/23 1612 01/07/23 1613  BP: (Abnormal) 156/110 (Abnormal) 146/93  Pulse: (Abnormal) 125   Resp: 16   Temp: 98.7 F (37.1 C)   SpO2: 97%     Comprehensive ROS Pertinent positive and negative noted in HPI   Exam General appearance : Awake, alert, not in any distress. Speech Clear. Not toxic looking HEENT: Atraumatic and Normocephalic, pupils equally reactive to light and accomodation Neck: Supple, no JVD. No cervical lymphadenopathy.  Chest: Good air entry bilaterally, no added sounds  CVS: S1 S2 regular, no murmurs.  Abdomen: Bowel sounds present, Non tender and not distended with no gaurding, rigidity or rebound. Extremities: B/L Lower Ext shows no edema, both legs are warm to touch Neurology: Awake alert, and oriented X 3, CN II-XII intact, Non focal Skin: No Rash  Data Review No results found for: "HGBA1C"  Assessment & Plan  Kelly Sheppard was seen today for uri.  Diagnoses and all orders for this visit:  Acute cough  2/2 Sore throat -     Respiratory Panel w/ SARS-CoV2  Essential hypertension BP goal - < 130/80 Explained that having normal blood pressure is the goal and medications are helping to get to goal and maintain normal blood pressure. DIET: Limit salt intake, read nutrition labels to check salt content, limit fried and high fatty foods  Avoid using multisymptom OTC cold preparations that generally contain sudafed which can rise BP. Consult with pharmacist on best cold relief products to use for persons with HTN EXERCISE Discussed incorporating exercise such as walking - 30 minutes most days of the week and can do in 10 minute intervals     Moderate persistent asthma with acute exacerbation Has SABA will need to add LABA  Other orders -     Specimen status report     Patient have been counseled extensively about nutrition and exercise. Other issues discussed during this visit include: low cholesterol diet, weight control and daily exercise, foot care, annual eye examinations at Ophthalmology, importance of adherence with medications and regular follow-up. We also discussed long term complications of uncontrolled diabetes and hypertension.   No follow-ups on file.  The patient was given clear instructions to go to ER or return to medical center if symptoms don't improve, worsen or new problems develop. The patient verbalized understanding. The patient was told to call to get lab results if they haven't heard anything in the next week.   This note has  been created with Education officer, environmental. Any transcriptional errors are unintentional.   [name], NP 01/12/2023, 2:03 PM

## 2023-01-14 ENCOUNTER — Other Ambulatory Visit: Payer: Self-pay

## 2023-01-14 LAB — RESPIRATORY PANEL W/ SARS-COV2

## 2023-01-14 NOTE — Progress Notes (Signed)
   CORETTA LEISEY 01/09/1986 [id number]  Patient outreached by Mack Guise , PharmD Candidate on 01/14/2023.  Blood Pressure Readings: Last documented ambulatory systolic blood pressure: 146 Last documented ambulatory diastolic blood pressure: 93 Does the patient have a validated home blood pressure machine?: Yes They report home readings NA  Medication review was performed. Is the patient taking their medications as prescribed?: Yes Differences from their prescribed list include: No  The following barriers to adherence were noted: Does the patient have cost concerns?: No Does the patient have transportation concerns?: No Does the patient need assistance obtaining refills?: No Does the patient occassionally forget to take some of their prescribed medications?: No Does the patient feel like one/some of their medications make them feel poorly?: No Does the patient have questions or concerns about their medications?: No   Interventions: Interventions Completed: Medications were reviewed, Patient was educated on proper technique to check home blood pressure and reminded to bring home machine and readings to next provider appointment  The patient has follow up scheduled:  PCP: [name], NP   Mack Guise, Student-PharmD

## 2023-01-15 ENCOUNTER — Ambulatory Visit (INDEPENDENT_AMBULATORY_CARE_PROVIDER_SITE_OTHER): Payer: Medicaid Other | Admitting: Primary Care

## 2023-01-15 ENCOUNTER — Encounter (INDEPENDENT_AMBULATORY_CARE_PROVIDER_SITE_OTHER): Payer: Self-pay | Admitting: Primary Care

## 2023-01-15 VITALS — BP 146/97 | HR 95 | Resp 16 | Wt 352.0 lb

## 2023-01-15 DIAGNOSIS — I10 Essential (primary) hypertension: Secondary | ICD-10-CM | POA: Diagnosis not present

## 2023-01-15 DIAGNOSIS — J302 Other seasonal allergic rhinitis: Secondary | ICD-10-CM | POA: Diagnosis not present

## 2023-01-15 DIAGNOSIS — R051 Acute cough: Secondary | ICD-10-CM

## 2023-01-15 DIAGNOSIS — E039 Hypothyroidism, unspecified: Secondary | ICD-10-CM

## 2023-01-15 DIAGNOSIS — N920 Excessive and frequent menstruation with regular cycle: Secondary | ICD-10-CM | POA: Diagnosis not present

## 2023-01-15 MED ORDER — FLUTICASONE PROPIONATE 50 MCG/ACT NA SUSP
2.0000 | Freq: Every day | NASAL | 6 refills | Status: AC
Start: 2023-01-15 — End: ?

## 2023-01-15 MED ORDER — GUAIFENESIN 200 MG PO TABS: 200.0000 mg | ORAL_TABLET | ORAL | 0 refills | Status: AC | PRN

## 2023-01-15 MED ORDER — FEXOFENADINE HCL 180 MG PO TABS
180.0000 mg | ORAL_TABLET | Freq: Every day | ORAL | 2 refills | Status: AC
Start: 2023-01-15 — End: ?

## 2023-01-15 NOTE — Progress Notes (Signed)
[facility]  Kelly Sheppard, is a 37 y.o. female  [id number]  [id number]  DOB - 12-05-1985  Chief Complaint  Patient presents with   Hypertension   Cough    Green phlegm       Subjective:   Kelly Sheppard is a 37 y.o. female here today for a follow up visit for HTN. Patient has No headache, No chest pain, No abdominal pain - No Nausea, No new weakness tingling or numbness, No Cough - shortness of breath. During pregnancy she was on Procardia 30 XL than 90 XL she still has bottle of both medication in date. She will start taking Procardia 30 xl and re ck Bp. She continues to have a productive cough phlegm green.   No problems updated.  Allergies  Allergen Reactions   Latex     Past Medical History:  Diagnosis Date   Essential hypertension 12/11/2021   Hypertension     Current Outpatient Medications on File Prior to Visit  Medication Sig Dispense Refill   albuterol (VENTOLIN HFA) 108 (90 Base) MCG/ACT inhaler TAKE 2 PUFFS BY MOUTH EVERY 6 HOURS AS NEEDED FOR WHEEZE OR SHORTNESS OF BREATH 18 each 1   fluticasone (FLOVENT HFA) 44 MCG/ACT inhaler Inhale 2 puffs into the lungs 2 (two) times daily. 31.8 g 3   levothyroxine (SYNTHROID) 50 MCG tablet PLEASE SEE ATTACHED FOR DETAILED DIRECTIONS (Patient not taking: Reported on 08/01/2021)     No current facility-administered medications on file prior to visit.    Objective:   Vitals:   01/15/23 1422 01/15/23 1423  BP: (Abnormal) 153/91 (Abnormal) 146/97  Pulse: 95   Resp: 16   SpO2: 99%   Weight: (Abnormal) 352 lb (159.7 kg)     Comprehensive ROS Pertinent positive and negative noted in HPI   Exam General appearance : Awake, alert, not in any distress. Speech Clear. Not toxic looking HEENT: Atraumatic and Normocephalic, pupils equally reactive to light and accomodation Neck: Supple, no JVD. No cervical lymphadenopathy.  Chest: Good air entry bilaterally, no added sounds  CVS: S1 S2 regular, no  murmurs.  Abdomen: Bowel sounds present, Non tender and not distended with no gaurding, rigidity or rebound. Extremities: B/L Lower Ext shows no edema, both legs are warm to touch Neurology: Awake alert, and oriented X 3, CN II-XII intact, Non focal Skin: No Rash  Data Review No results found for: "HGBA1C"  Assessment & Plan  Kelly Sheppard was seen today for hypertension and cough.  Diagnoses and all orders for this visit:  Acute cough/Seasonal allergies -     guaiFENesin 200 MG tablet; Take 1 tablet (200 mg total) by mouth every 4 (four) hours as needed for cough or to loosen phlegm. -     fluticasone (FLONASE) 50 MCG/ACT nasal spray; Place 2 sprays into both nostrils daily.  Essential hypertension BP goal - < 130/80 Explained that having normal blood pressure is the goal and medications are helping to get to goal and maintain normal blood pressure. DIET: Limit salt intake, read nutrition labels to check salt content, limit fried and high fatty foods  Avoid using multisymptom OTC cold preparations that generally contain sudafed which can rise BP. Consult with pharmacist on best cold relief products to use for persons with HTN EXERCISE Discussed incorporating exercise such as walking - 30 minutes most days of the week and can do in 10 minute intervals    -     CMP14+EGFR Procardia XL 30mg  daily   Hypothyroidism, unspecified type -  TSH + free T4  Menorrhagia with regular cycle -     CBC with Differential  Morbid obesity (HCC) -     Amb ref to Medical Nutrition Therapy-MNT  Patient have been counseled extensively about nutrition and exercise. Other issues discussed during this visit include: low cholesterol diet, weight control and daily exercise, foot care, annual eye examinations at Ophthalmology, importance of adherence with medications and regular follow-up. We also discussed long term complications of uncontrolled diabetes and hypertension.   No follow-ups on file.  The  patient was given clear instructions to go to ER or return to medical center if symptoms don't improve, worsen or new problems develop. The patient verbalized understanding. The patient was told to call to get lab results if they haven't heard anything in the next week.   This note has been created with Education officer, environmental. Any transcriptional errors are unintentional.   [name], NP 01/15/2023, 2:28 PM

## 2023-01-16 LAB — CBC WITH DIFFERENTIAL/PLATELET
Basophils Absolute: 0.1 10*3/uL (ref 0.0–0.2)
Basos: 1 %
EOS (ABSOLUTE): 0.3 10*3/uL (ref 0.0–0.4)
Eos: 4 %
Hematocrit: 39.5 % (ref 34.0–46.6)
Hemoglobin: 12.6 g/dL (ref 11.1–15.9)
Immature Grans (Abs): 0 10*3/uL (ref 0.0–0.1)
Immature Granulocytes: 0 %
Lymphocytes Absolute: 1.8 10*3/uL (ref 0.7–3.1)
Lymphs: 25 %
MCH: 24.4 pg — ABNORMAL LOW (ref 26.6–33.0)
MCHC: 31.9 g/dL (ref 31.5–35.7)
MCV: 77 fL — ABNORMAL LOW (ref 79–97)
Monocytes Absolute: 0.6 10*3/uL (ref 0.1–0.9)
Monocytes: 8 %
Neutrophils Absolute: 4.6 10*3/uL (ref 1.4–7.0)
Neutrophils: 62 %
Platelets: 370 10*3/uL (ref 150–450)
RBC: 5.16 x10E6/uL (ref 3.77–5.28)
RDW: 13.9 % (ref 11.7–15.4)
WBC: 7.3 10*3/uL (ref 3.4–10.8)

## 2023-01-16 LAB — CMP14+EGFR
ALT: 12 IU/L (ref 0–32)
AST: 14 IU/L (ref 0–40)
Albumin/Globulin Ratio: 1.3
Albumin: 3.8 g/dL — ABNORMAL LOW (ref 3.9–4.9)
Alkaline Phosphatase: 88 IU/L (ref 44–121)
BUN/Creatinine Ratio: 20 (ref 9–23)
BUN: 14 mg/dL (ref 6–20)
Bilirubin Total: <0.2 mg/dL (ref 0.0–1.2)
CO2: 23 mmol/L (ref 20–29)
Calcium: 9.2 mg/dL (ref 8.7–10.2)
Chloride: 102 mmol/L (ref 96–106)
Creatinine, Ser: 0.71 mg/dL (ref 0.57–1.00)
Globulin, Total: 3 g/dL (ref 1.5–4.5)
Glucose: 73 mg/dL (ref 70–99)
Potassium: 4.3 mmol/L (ref 3.5–5.2)
Sodium: 137 mmol/L (ref 134–144)
Total Protein: 6.8 g/dL (ref 6.0–8.5)
eGFR: 114 mL/min/{1.73_m2} (ref >59)

## 2023-01-16 LAB — TSH+FREE T4
Free T4: 1.39 ng/dL (ref 0.82–1.77)
TSH: 1.66 u[IU]/mL (ref 0.450–4.500)

## 2023-01-18 ENCOUNTER — Encounter: Payer: Medicaid Other | Attending: Primary Care | Admitting: Dietician

## 2023-01-18 ENCOUNTER — Encounter: Payer: Self-pay | Admitting: Dietician

## 2023-01-18 NOTE — Progress Notes (Signed)
Medical Nutrition Therapy  Appointment Start time:  [phone]  Appointment End time:  1055  Primary concerns today: Pt concerned about her overall health and wants to work on behavioral changes and weight loss.    Referral diagnosis: E66.01 Preferred learning style: no preference indicated Learning readiness: ready   NUTRITION ASSESSMENT   Anthropometrics  Not assessed (virtual visit) Ht: 62 in  Clinical Medical Hx: hypertension Medications: reviewed Labs: reviewed Notable Signs/Symptoms: none reported Food Allergies: fish eggs  Lifestyle & Dietary Hx  This is a virtual visit.   Pt states she feels like everyone suggest bariatric surgery or ozempic to her but she wants to work on weight loss on her own.  Pt states she has family history of heart disease and diabetes, and she has a 50 year old kid and two 37 year old twins that she wants to be healthy and live a long life for.   Pt states she scheduled a 5k in August that she wants to complete and is planning on going to [employer] in the fall and wants to be able to be comfortable walking around and on rides.    Pt reports she just finished breastfeeding her twins.   Pt report weight of 356 lbs.   Pt states she cooks breakfast for her twins but doesn't eat it. She states she did baby led weaning and offers them MyPlate meals including protein, veggies, fruits, etc. But she is not usually having this food with them.  Pt states she loves soda. She states she quit drinking it 1 week ago and lost 7 lb. Pt states she was drinking 60 oz soda daily, and extra sweet tea 3-4 bottles daily. Pt state she was also drinking sugar free monster but she stopped because it was making her anxiety worse.    Estimated daily fluid intake: 60 oz water Supplements: not assessed Sleep: 4 hours, has twins Stress / self-care: moderate to high stress Current average weekly physical activity: ADLs  24-Hr Dietary Recall First Meal: 10am, usually  skips but just started having egg white omelet with swiss and ham cubes  Snack: none OR fruit Second Meal: 2pm: snacks on kids lunch: nuggets and zuchinni Snack: fruit OR ritz bitz OR nuts Third Meal: 11pm: tyson chicken sliders and french fries Snack: none Beverages: 1 sweet tea, 60 oz water   NUTRITION DIAGNOSIS  NB-1.1 Food and nutrition-related knowledge deficit As related to lack of prior nutrition education by a nutrition professional.  As evidenced by pt report.   NUTRITION INTERVENTION  Nutrition education (E-1) on the following topics:  Fruits & Vegetables: Aim to fill half your plate with a variety of fruits and vegetables. They are rich in vitamins, minerals, and fiber, and can help reduce the risk of chronic diseases. Choose a colorful assortment of fruits and vegetables to ensure you get a wide range of nutrients. Grains and Starches: Make at least half of your grain choices whole grains, such as brown rice, whole wheat bread, and oats. Whole grains provide fiber, which aids in digestion and healthy cholesterol levels. Aim for whole forms of starchy vegetables such as potatoes, sweet potatoes, beans, peas, and corn, which are fiber rich and provide many vitamins and minerals.  Protein: Incorporate lean sources of protein, such as poultry, fish, beans, nuts, and seeds, into your meals. Protein is essential for building and repairing tissues, staying full, balancing blood sugar, as well as supporting immune function. Dairy: Include low-fat or fat-free dairy products like  milk, yogurt, and cheese in your diet. Dairy foods are excellent sources of calcium and vitamin D, which are crucial for bone health.  Physical Activity: Aim for 60 minutes of physical activity daily. Regular physical activity promotes overall health-including helping to reduce risk for heart disease and diabetes, promoting mental health, and helping [place] sleep better.   Handouts Provided Include  No handouts provided  (virtual visit)  Learning Style & Readiness for Change Teaching method utilized: Visual & Auditory  Demonstrated degree of understanding via: Teach Back  Barriers to learning/adherence to lifestyle change: none  Goals Established by [facility]   Goal: aim to eat at least 2 "MyPlate" meals and 1 snack daily.   Goal: drink at least 64 oz of water daily.   Continue to reduce sugar-sweetened beverage intake.   Aim to eat within 1-2 hours of waking up and every 3-5 hours following. Avoid skipping meals.   When snacking, aim to include a complex carb and protein.  At meals, aim to include 1/2 plate non-starchy vegetables, 1/4 plate protein, and 1/4 plate complex carbs.    MONITORING & EVALUATION Dietary intake, weekly physical activity, and follow up in 6 weeks.  Next Steps  Patient is to call for questions.

## 2023-01-18 NOTE — Patient Instructions (Addendum)
[facility] a [facility].   Goal: aim to eat at least 2 "MyPlate" meals and 1 snack daily.   Goal: drink at least 64 oz of water daily.   Continue to reduce sugar-sweetened beverage intake.   Aim to eat within 1-2 hours of waking up and every 3-5 hours following. Avoid skipping meals.   When snacking, aim to include a complex carb and protein.  At meals, aim to include 1/2 plate non-starchy vegetables, 1/4 plate protein, and 1/4 plate complex carbs.

## 2023-01-21 DIAGNOSIS — J019 Acute sinusitis, unspecified: Secondary | ICD-10-CM | POA: Diagnosis not present

## 2023-02-05 ENCOUNTER — Ambulatory Visit (INDEPENDENT_AMBULATORY_CARE_PROVIDER_SITE_OTHER): Payer: Medicaid Other | Admitting: Primary Care

## 2023-03-04 DIAGNOSIS — R87612 Low grade squamous intraepithelial lesion on cytologic smear of cervix (LGSIL): Secondary | ICD-10-CM | POA: Diagnosis not present

## 2023-03-07 ENCOUNTER — Encounter: Payer: Self-pay | Admitting: Dietician

## 2023-03-07 ENCOUNTER — Encounter: Payer: Medicaid Other | Attending: Primary Care | Admitting: Dietician

## 2023-03-07 NOTE — Progress Notes (Signed)
Medical Nutrition Therapy  Appointment Start time:  1400  Appointment End time:  1430  Primary concerns today: Pt concerned about her overall health and wants to work on behavioral changes and weight loss.    Referral diagnosis: E66.01 Preferred learning style: no preference indicated Learning readiness: ready   NUTRITION ASSESSMENT   Anthropometrics  Not assessed (virtual visit) Ht: 62 in Wt: 01/15/23 (pt report): 356 lb  Clinical Medical Hx: hypertension Medications: reviewed Labs: reviewed Notable Signs/Symptoms: none reported Food Allergies: fish eggs  Lifestyle & Dietary Hx  This is a virtual visit.   Pt states she did good about exercising at the beginning and [facility] but recently went on vacation which she feels threw her out of this routine and she has not started back.   Pt states she went to visit family for a week. She states she also ate differently. Pt states she did not drink soda for 30 days but allowed herself to have it on vacation. Pt states she usually drinks lemonade or extra sweet tea now.   Pt states she has a small garden. She states she has been able to eat a lot of fresh vegetables from her garden.   Pt states she feels that her water intake has been low.   Estimated daily fluid intake: 32 oz water Supplements: not assessed Sleep: 4 hours, has twins Stress / self-care: moderate to high stress Current average weekly physical activity: ADLs  24-Hr Dietary Recall First Meal: none (or a few bites of kids eggs) Snack: none OR fruit Second Meal: snack OR lettuce wrap OR chicken salad Snack: 2 slice cheese OR nuts OR [place] granola bar and cup of milk Third Meal: 11pm: broccoli chicken and rice casserole OR salad with chicken Snack: none Beverages: 1 c milk, 16-32 oz sweet tea, 32 oz water   NUTRITION DIAGNOSIS  NB-1.1 Food and nutrition-related knowledge deficit As related to lack of prior nutrition  education by a nutrition professional.  As evidenced by pt report.   NUTRITION INTERVENTION  Nutrition education (E-1) on the following topics:  Discussed the importance of consistent mealtimes and strategies to build more balanced meals and snacks Aim for 150 minutes of physical activity weekly. Make physical activity a part of your week. Try to include at least 30 minutes of physical activity 5 days each week or at least 150 minutes per week. Regular physical activity promotes overall health-including helping to reduce risk for heart disease and diabetes, promoting mental health, and helping [place] sleep better.    Discussed strategies to increase fluid intake, including setting times for intake, getting a new water bottle, adding flavor to water, etc.  Importance of reducing sugar sweetened beverage intake and assessed sugar quantity in common drinks such as sweet tea and lemonade.   Handouts Provided Include  Meal Ideas (provided over email)  Learning Style & Readiness for Change Teaching method utilized: Visual & Auditory  Demonstrated degree of understanding via: Teach Back  Barriers to learning/adherence to lifestyle change: none  Goals Established by Pt  Pt wants to continue all previous goals:  Goal: [facility] at least 3 days a week. - in progress, continue.  Goal: aim to eat at least 2 "MyPlate" meals and 1 snack daily. - in progress, continue.  Goal: drink at least 64 oz of water daily. (Aim for 2 by noon and 2 by the end of day). -  in progress, continue,   Continue to reduce sugar-sweetened beverage intake. - in progress, continue.   Aim to eat within 1-2 hours of waking up and every 3-5 hours following. Avoid skipping meals.   When snacking, aim to include a complex carb and protein.  At meals, aim to include 1/2 plate non-starchy vegetables, 1/4 plate protein, and 1/4 plate complex carbs.    MONITORING & EVALUATION Dietary intake, weekly physical  activity, and follow up in 6 weeks.  Next Steps  Patient is to call for questions.

## 2023-03-27 DIAGNOSIS — I5021 Acute systolic (congestive) heart failure: Secondary | ICD-10-CM | POA: Diagnosis not present

## 2023-03-27 DIAGNOSIS — I517 Cardiomegaly: Secondary | ICD-10-CM | POA: Diagnosis not present

## 2023-03-27 DIAGNOSIS — R000 Tachycardia, unspecified: Secondary | ICD-10-CM | POA: Diagnosis not present

## 2023-03-27 DIAGNOSIS — I10 Essential (primary) hypertension: Secondary | ICD-10-CM | POA: Diagnosis not present

## 2023-04-04 ENCOUNTER — Telehealth (INDEPENDENT_AMBULATORY_CARE_PROVIDER_SITE_OTHER): Payer: Self-pay | Admitting: Primary Care

## 2023-04-04 NOTE — Telephone Encounter (Signed)
Copied from CRM [id number]. Topic: General - Other >> Apr 03, 2023  4:24 PM [place] B wrote: Reason for CRM: pt called in wants orders for std screening

## 2023-04-04 NOTE — Telephone Encounter (Signed)
Please contact pt and schedule a nurse visit

## 2023-04-05 ENCOUNTER — Other Ambulatory Visit (HOSPITAL_COMMUNITY)
Admission: RE | Admit: 2023-04-05 | Discharge: 2023-04-05 | Disposition: A | Discharge status: Home / Self Care | Payer: Medicaid Other | Source: Ambulatory Visit | Attending: Primary Care | Admitting: Primary Care

## 2023-04-05 ENCOUNTER — Ambulatory Visit (INDEPENDENT_AMBULATORY_CARE_PROVIDER_SITE_OTHER): Payer: Medicaid Other

## 2023-04-05 DIAGNOSIS — Z113 Encounter for screening for infections with a predominantly sexual mode of transmission: Secondary | ICD-10-CM

## 2023-04-05 DIAGNOSIS — R300 Dysuria: Secondary | ICD-10-CM | POA: Diagnosis not present

## 2023-04-05 LAB — POCT URINALYSIS DIP (CLINITEK)
Bilirubin, UA: NEGATIVE
Blood, UA: NEGATIVE
Glucose, UA: NEGATIVE mg/dL
Ketones, POC UA: NEGATIVE mg/dL
Leukocytes, UA: NEGATIVE
Nitrite, UA: NEGATIVE
POC PROTEIN,UA: NEGATIVE
Spec Grav, UA: 1.02 (ref 1.010–1.025)
Urobilinogen, UA: 0.2 E.U./dL
pH, UA: 7.5 (ref 5.0–8.0)

## 2023-04-10 ENCOUNTER — Telehealth: Payer: Self-pay | Admitting: Primary Care

## 2023-04-10 NOTE — Telephone Encounter (Signed)
Copied from CRM [id number]. Topic: General - Other >> Apr 10, 2023  1:05 PM Dominique A wrote: Reason for CRM: Pt is calling back for her lab results from her lab that was done on 04/05/2023. Pt is wanting to know if any medication will be called in for her. Please call pt back to discuss.

## 2023-04-11 ENCOUNTER — Telehealth (INDEPENDENT_AMBULATORY_CARE_PROVIDER_SITE_OTHER): Payer: Self-pay | Admitting: Primary Care

## 2023-04-11 ENCOUNTER — Other Ambulatory Visit (INDEPENDENT_AMBULATORY_CARE_PROVIDER_SITE_OTHER): Payer: Self-pay | Admitting: Primary Care

## 2023-04-11 DIAGNOSIS — N760 Acute vaginitis: Secondary | ICD-10-CM

## 2023-04-11 LAB — CERVICOVAGINAL ANCILLARY ONLY
Bacterial Vaginitis (gardnerella): POSITIVE — AB
Candida Glabrata: NEGATIVE
Candida Vaginitis: NEGATIVE
Chlamydia: NEGATIVE
Comment: NEGATIVE
Comment: NEGATIVE
Comment: NEGATIVE
Comment: NEGATIVE
Comment: NEGATIVE
Comment: NORMAL
Neisseria Gonorrhea: NEGATIVE
Trichomonas: NEGATIVE

## 2023-04-11 MED ORDER — METRONIDAZOLE 500 MG PO TABS
500.0000 mg | ORAL_TABLET | Freq: Two times a day (BID) | ORAL | 0 refills | Status: AC
Start: 2023-04-11 — End: ?

## 2023-04-11 NOTE — Telephone Encounter (Signed)
Will forward to provider  

## 2023-04-11 NOTE — Telephone Encounter (Signed)
Will forward top provider

## 2023-04-11 NOTE — Telephone Encounter (Signed)
Pt is calling to request diflucan for yeast with the antibiotic. Please advise CB- [phone]

## 2023-04-12 ENCOUNTER — Encounter (INDEPENDENT_AMBULATORY_CARE_PROVIDER_SITE_OTHER): Payer: Self-pay | Admitting: Primary Care

## 2023-04-12 ENCOUNTER — Other Ambulatory Visit (INDEPENDENT_AMBULATORY_CARE_PROVIDER_SITE_OTHER): Payer: Self-pay | Admitting: Primary Care

## 2023-04-12 MED ORDER — FLUCONAZOLE 150 MG PO TABS: 150.0000 mg | ORAL_TABLET | Freq: Every day | ORAL | 1 refills | Status: AC

## 2023-04-16 DIAGNOSIS — N760 Acute vaginitis: Secondary | ICD-10-CM | POA: Diagnosis not present

## 2023-05-15 DIAGNOSIS — Z113 Encounter for screening for infections with a predominantly sexual mode of transmission: Secondary | ICD-10-CM | POA: Diagnosis not present

## 2023-05-15 DIAGNOSIS — R300 Dysuria: Secondary | ICD-10-CM | POA: Diagnosis not present

## 2023-05-15 DIAGNOSIS — N898 Other specified noninflammatory disorders of vagina: Secondary | ICD-10-CM | POA: Diagnosis not present

## 2023-05-15 DIAGNOSIS — N760 Acute vaginitis: Secondary | ICD-10-CM | POA: Diagnosis not present

## 2023-05-18 IMAGING — US US OB < 14 WEEKS - US OB TV
1 series · 15 of 28 positions shown · non-contrast
Comparison: None.

CLINICAL DATA: In-vitro fertilization.

EXAM:
TWIN OBSTETRIC <14WK US AND TRANSVAGINAL OB US
TECHNIQUE: Both transabdominal and transvaginal ultrasound examinations were
performed for complete evaluation of the gestation as well as the
maternal uterus, adnexal regions, and pelvic cul-de-sac.
Transvaginal technique was performed to assess early pregnancy.

[Series 1: us ob comp less 14 wks · 38 acquisitions, 15 frames shown]
[im 1/38]
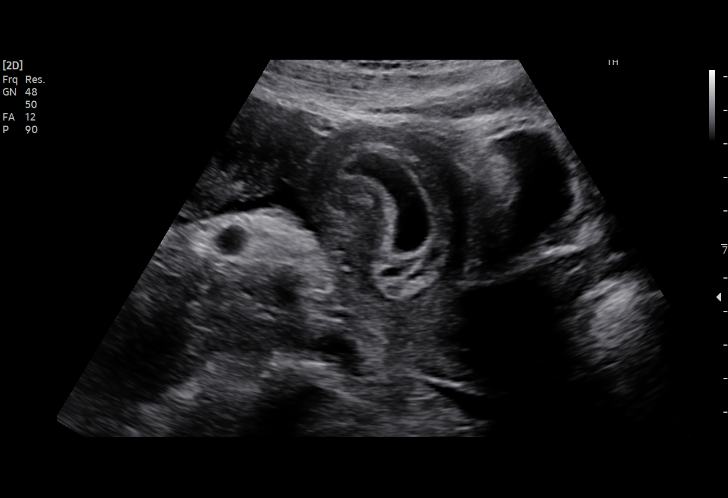
[im 3/38]
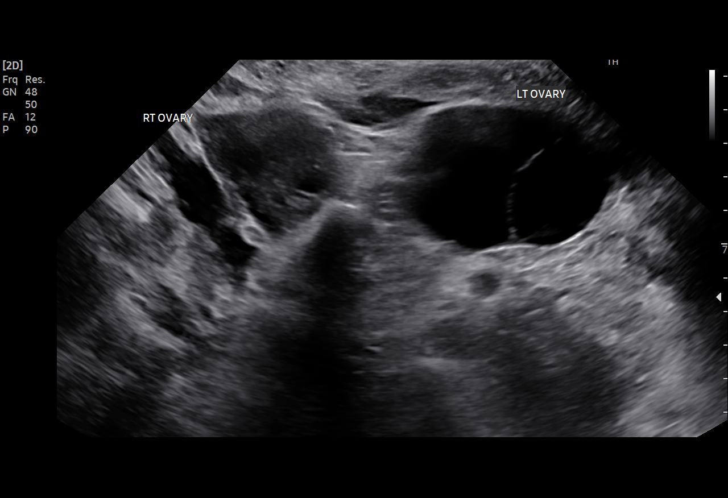
[im 6/38]
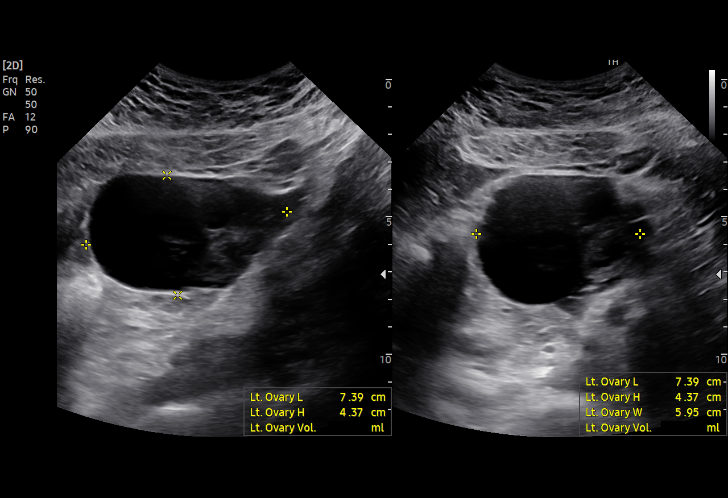
[im 9/38]
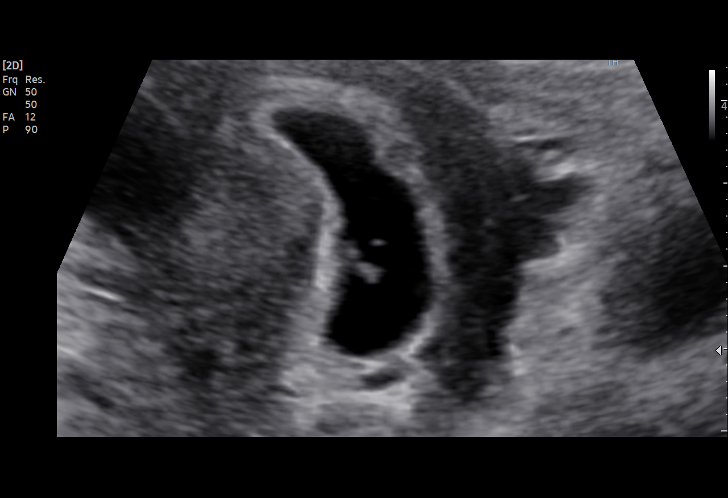
[im 11/38]
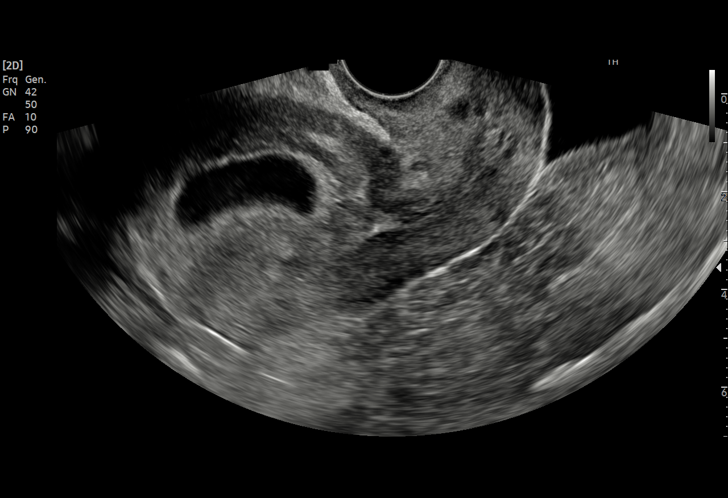
[im 14/38]
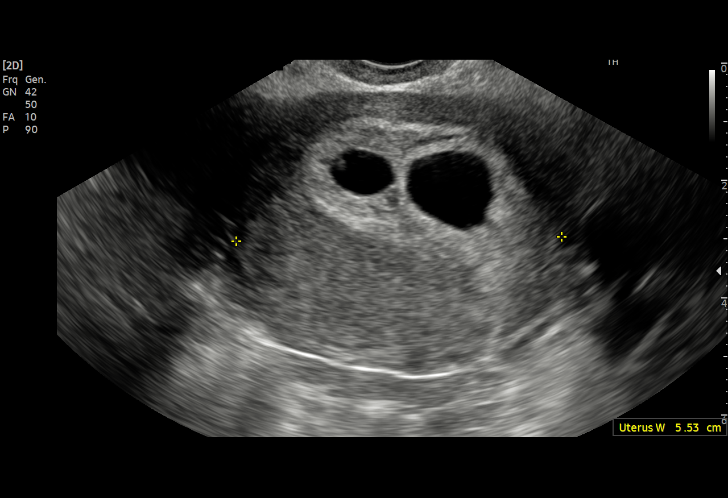
[im 17/38]
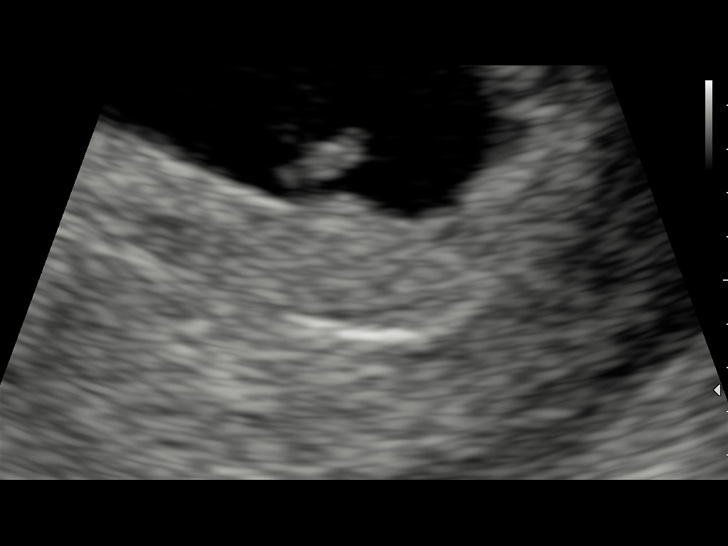
[im 20/38]
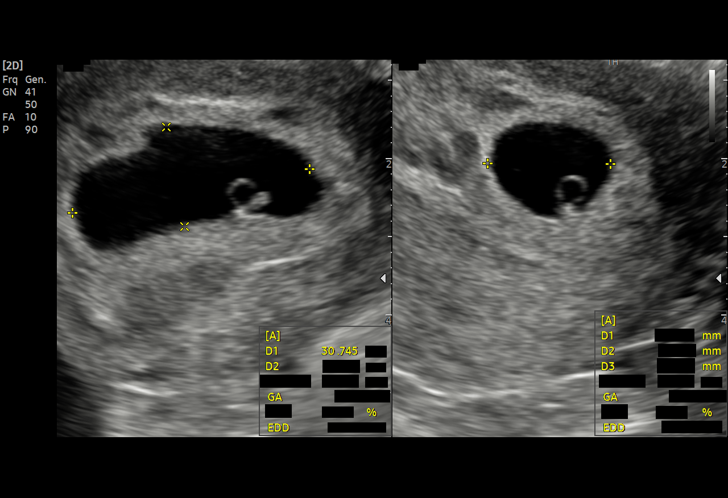
[im 21/38]
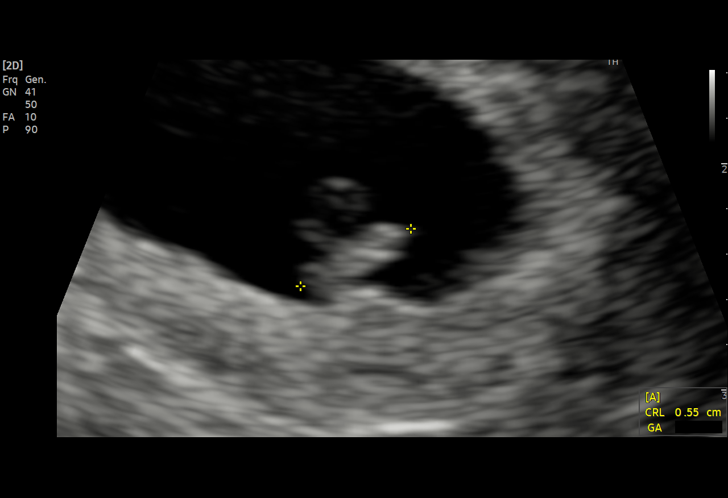
[im 24/38]
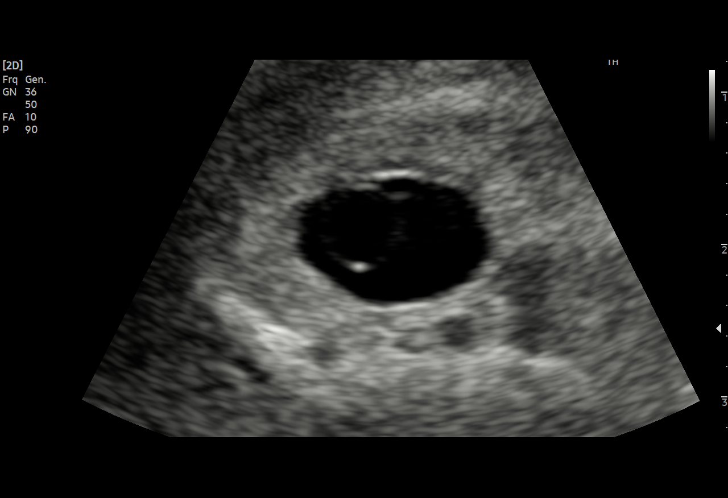
[im 27/38]
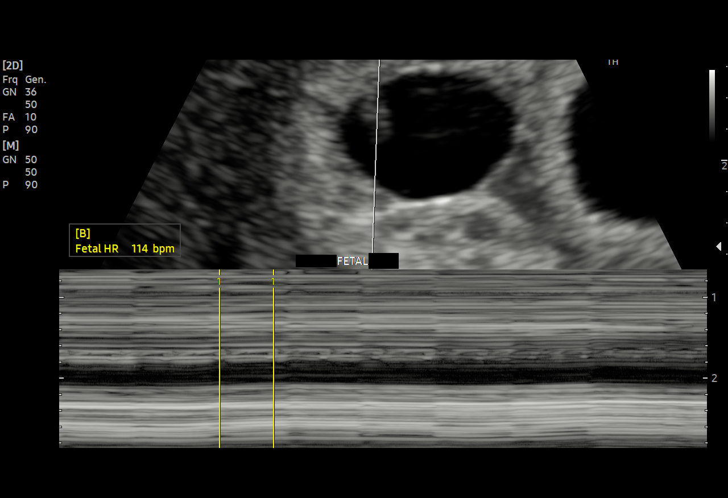
[im 29/38]
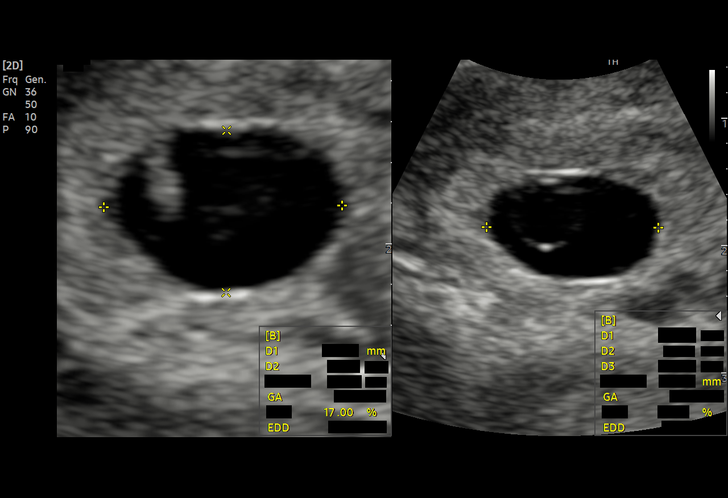
[im 32/38]
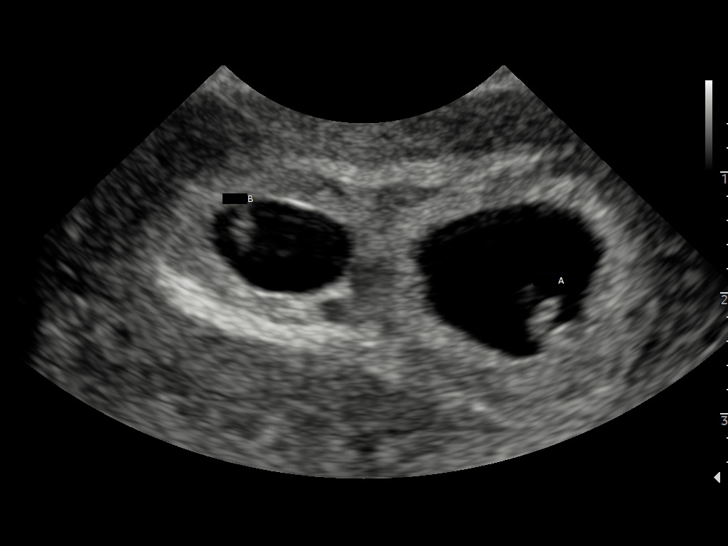
[im 35/38]
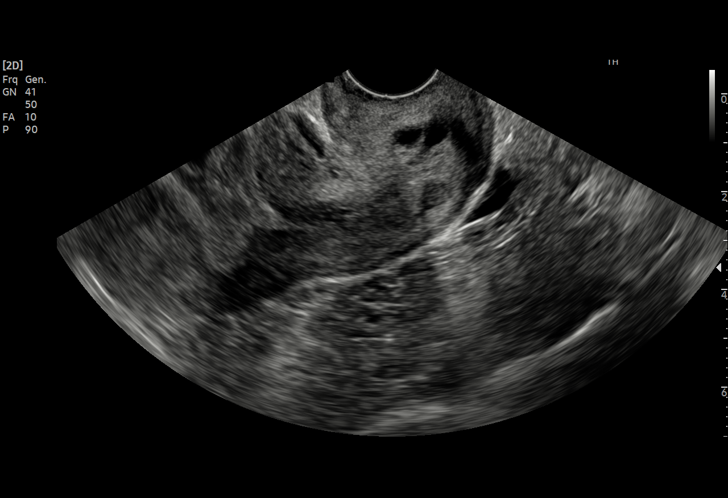
[im 38/38]
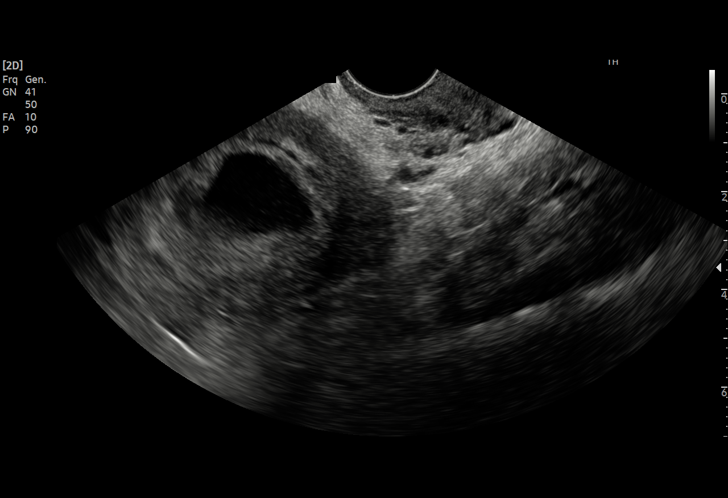

[15 of 28 positions shown; findings below may reference images not displayed]

FINDINGS: Number of IUPs:  2

Chorionicity/Amnionicity:  Dichorionic/diamniotic

TWIN 1

Yolk sac:  Visualized

Embryo:  Visualized

Cardiac Activity: Visualized

Heart Rate: 116 bpm

CRL:  5.7 mm mm   6 w 2 d                  US EDC: 01/10/2022

TWIN 2

Yolk sac:  Visualized

Embryo:  Visualized

Cardiac Activity: Visualized

Heart Rate: 114 bpm

MSD:

CRL:  4.2 mm   6 w   1 d        US EDC: 01/11/2022

Subchorionic hemorrhage:  None

Maternal uterus/adnexae: No intraperitoneal free fluid.
IMPRESSION: Dichorionic/diamniotic intrauterine twin gestation, as above.

## 2023-05-22 ENCOUNTER — Ambulatory Visit: Payer: Medicaid Other | Admitting: Dietician

## 2023-05-23 DIAGNOSIS — R1084 Generalized abdominal pain: Secondary | ICD-10-CM | POA: Diagnosis not present

## 2023-05-23 DIAGNOSIS — M6258 Muscle wasting and atrophy, not elsewhere classified, other site: Secondary | ICD-10-CM | POA: Diagnosis not present

## 2023-05-23 DIAGNOSIS — N3946 Mixed incontinence: Secondary | ICD-10-CM | POA: Diagnosis not present

## 2023-05-28 IMAGING — US US OB < 14 WEEKS - US OB TV
1 series · 15 of 28 positions shown · non-contrast
Comparison: None.

CLINICAL DATA: Vaginal bleeding in first trimester of pregnancy,
quantitative beta HCG 170,830; established EDD 01/09/2022

EXAM:
TWIN OBSTETRIC <14WK US AND TRANSVAGINAL OB US
TECHNIQUE: Both transabdominal and transvaginal ultrasound examinations were
performed for complete evaluation of the gestation as well as the
maternal uterus, adnexal regions, and pelvic cul-de-sac.
Transvaginal technique was performed to assess early pregnancy.

[Series 1: us ob comp less 14 wks · 15 of 82 slices shown]
[im 1/82]
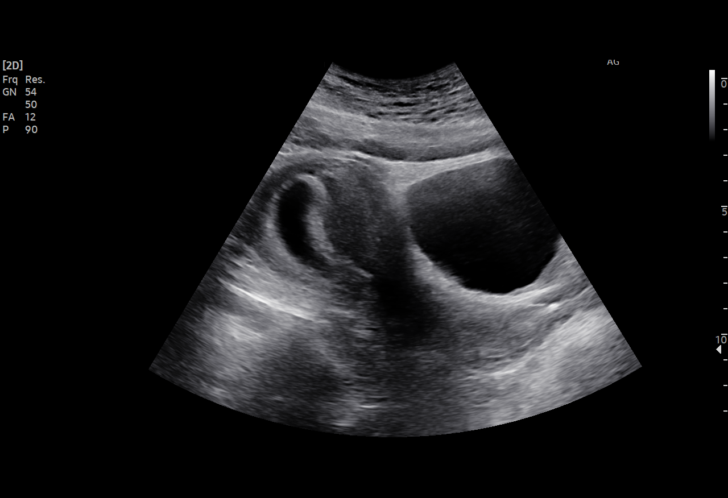
[im 7/82]
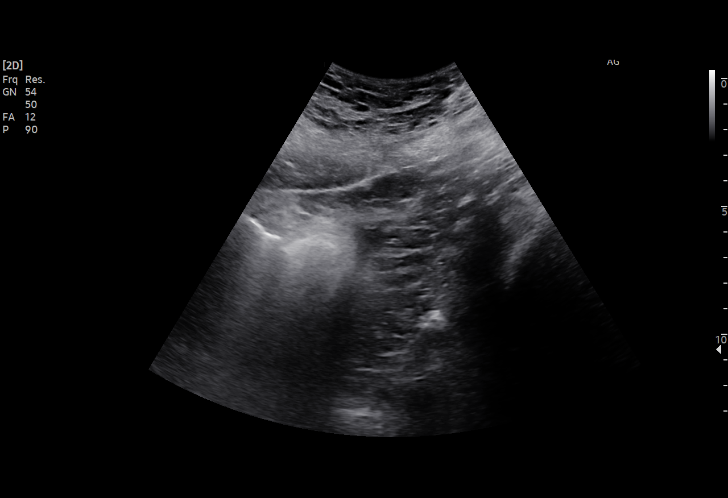
[im 13/82]
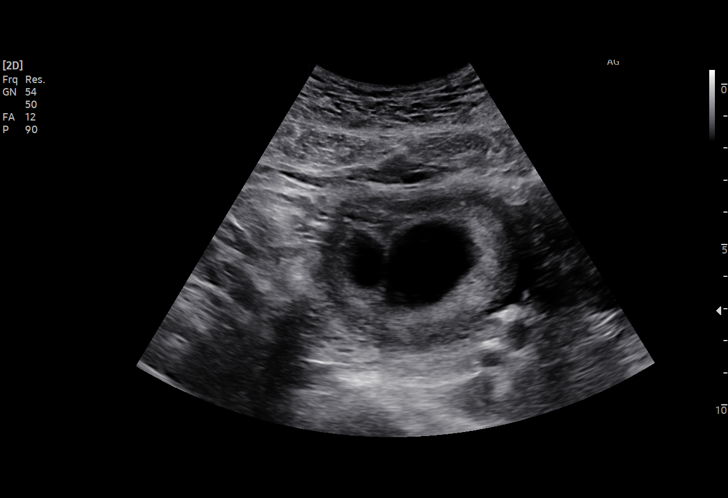
[im 19/82]
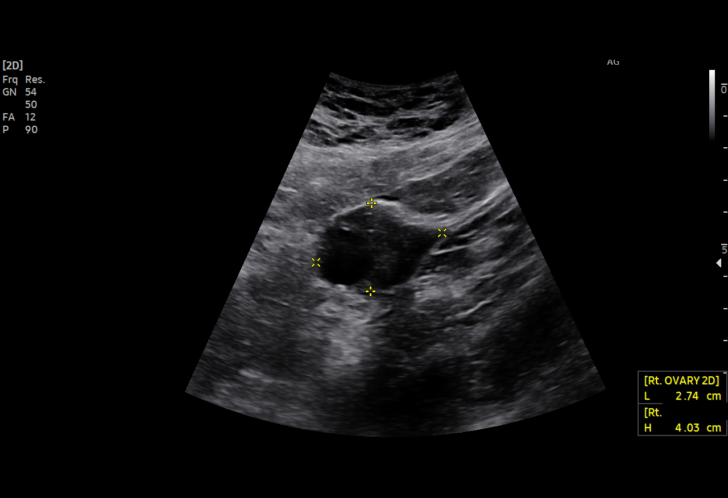
[im 25/82]
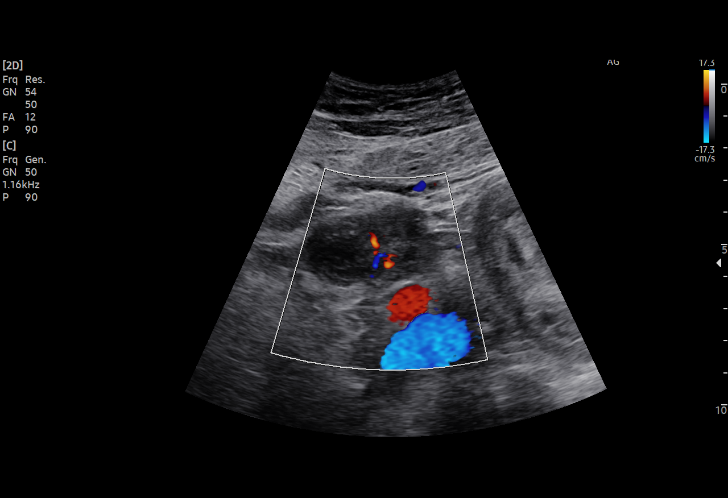
[im 31/82]
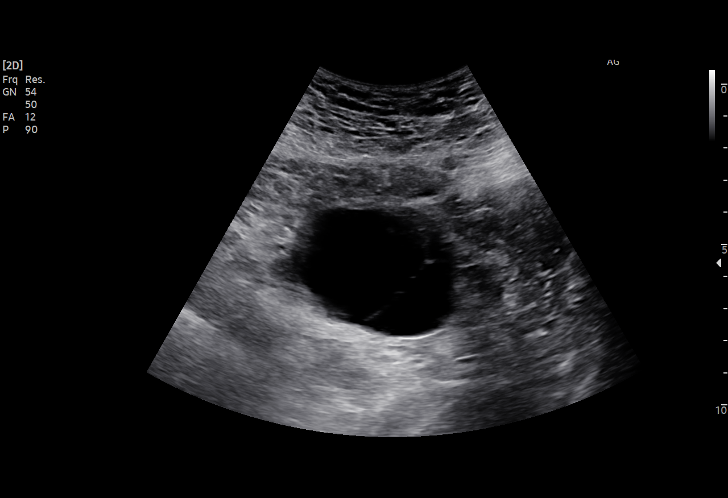
[im 37/82]
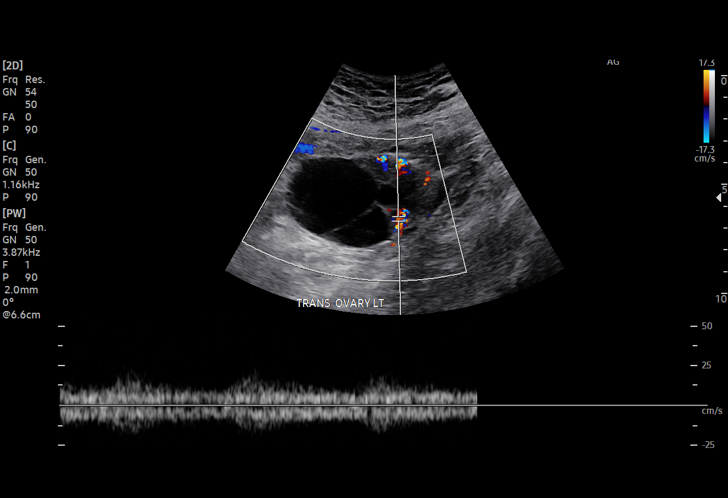
[im 43/82]
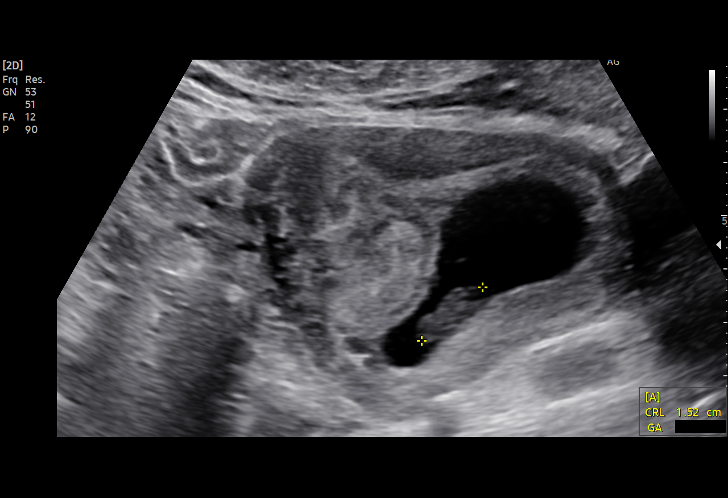
[im 46/82]
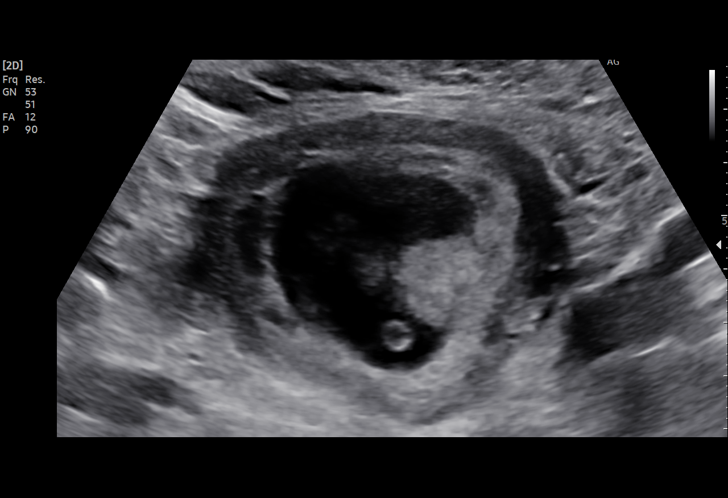
[im 52/82]
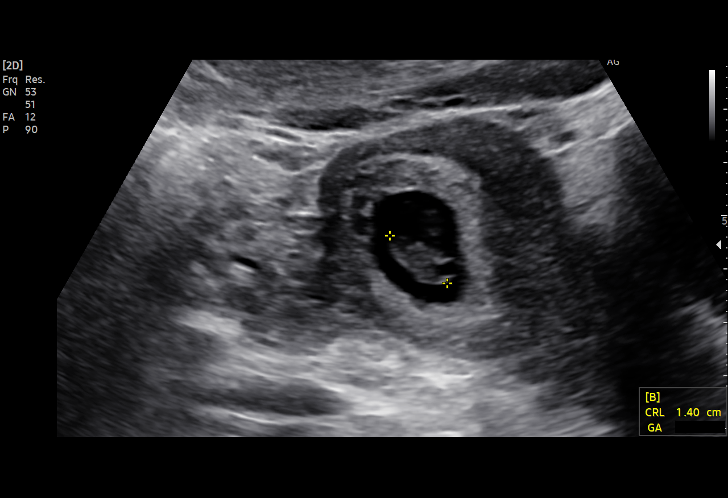
[im 58/82]
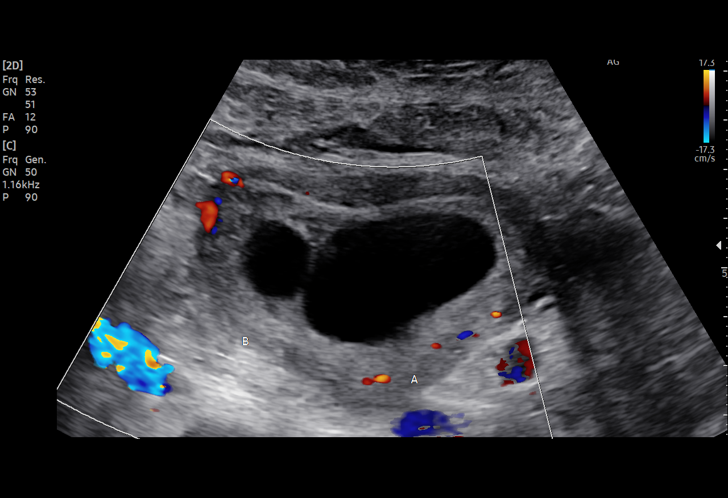
[im 64/82]
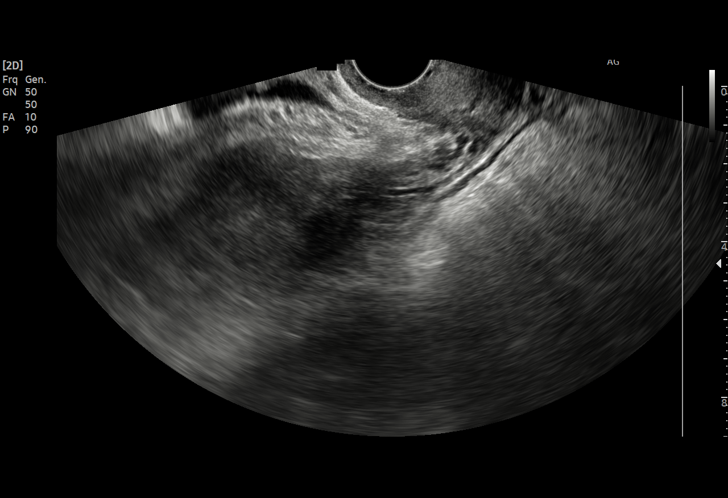
[im 70/82]
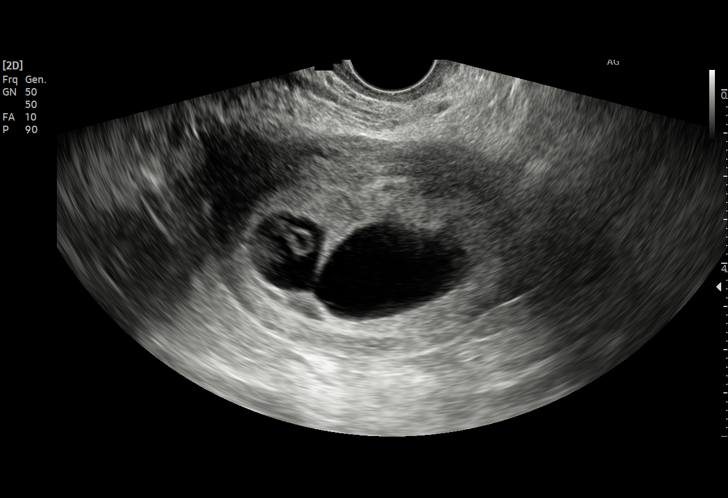
[im 76/82]
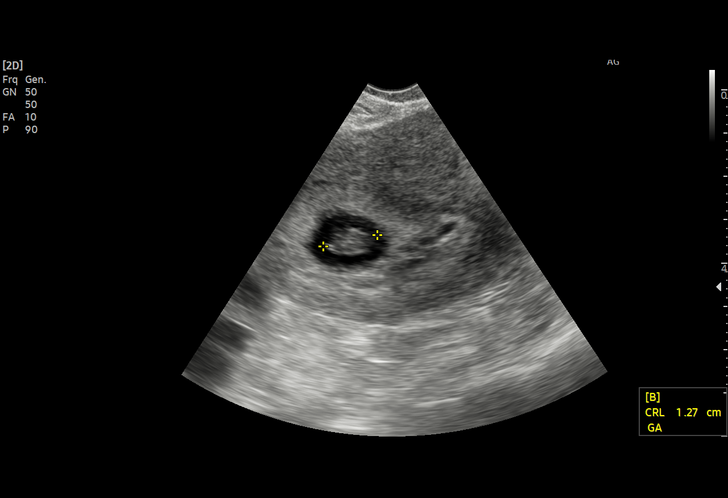
[im 82/82]
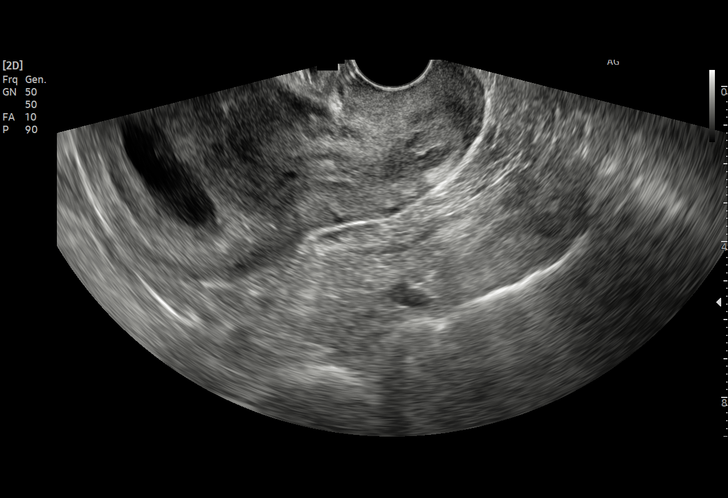

[15 of 28 positions shown; findings below may reference images not displayed]

FINDINGS: Number of IUPs:  2

Chorionicity/Amnionicity:  Dichorionic-diamniotic (thick membrane)

TWIN 1

Yolk sac:  Present

Embryo:  Present

Cardiac Activity: Present

Heart Rate: 153 bpm

CRL:  14 mm   7 w 5 d                  US EDC: 01/10/2022

TWIN 2

Yolk sac:  Present

Embryo:  Present

Cardiac Activity: Present

Heart Rate: 153 bpm

CRL:  13 mm   7 w 4 d                  US EDC: 01/11/2022

Subchorionic hemorrhage:  Small subchronic hemorrhage

Maternal uterus/adnexae:

Gestational sac of fetus 1 is larger than fetus 2, containing more
fluid.

Uterus anteverted, otherwise normal appearance.

RIGHT ovary normal size and morphology 2.7 x 4.0 x 3.8 cm.

LEFT ovary measures 7.8 x 3.8 x 6.2 cm and contains a cyst 5.0 x
x 5.3 cm in size which contains thin septations, versus less likely
a dominant simple cyst with adjacent follicles.

No free pelvic fluid or adnexal masses.
IMPRESSION: Live twin gestation as above.

Asymmetry of the J station all sacs, sac of fetus 1 containing more
fluid and larger than that of fetus 2.

Probable complicated cyst LEFT ovary 5.3 cm greatest size; recommend
attention on follow-up imaging to determine significance.

## 2023-06-04 DIAGNOSIS — E559 Vitamin D deficiency, unspecified: Secondary | ICD-10-CM | POA: Diagnosis not present

## 2023-06-04 DIAGNOSIS — F419 Anxiety disorder, unspecified: Secondary | ICD-10-CM | POA: Diagnosis not present

## 2023-06-04 DIAGNOSIS — R6882 Decreased libido: Secondary | ICD-10-CM | POA: Diagnosis not present

## 2023-06-04 DIAGNOSIS — Z1331 Encounter for screening for depression: Secondary | ICD-10-CM | POA: Diagnosis not present

## 2023-06-04 DIAGNOSIS — D649 Anemia, unspecified: Secondary | ICD-10-CM | POA: Diagnosis not present

## 2023-06-04 DIAGNOSIS — N951 Menopausal and female climacteric states: Secondary | ICD-10-CM | POA: Diagnosis not present

## 2023-06-04 DIAGNOSIS — R7303 Prediabetes: Secondary | ICD-10-CM | POA: Diagnosis not present

## 2023-06-04 DIAGNOSIS — F3342 Major depressive disorder, recurrent, in full remission: Secondary | ICD-10-CM | POA: Diagnosis not present

## 2023-06-04 DIAGNOSIS — F909 Attention-deficit hyperactivity disorder, unspecified type: Secondary | ICD-10-CM | POA: Diagnosis not present

## 2023-06-04 DIAGNOSIS — Z6841 Body Mass Index (BMI) 40.0 and over, adult: Secondary | ICD-10-CM | POA: Diagnosis not present

## 2023-06-04 DIAGNOSIS — F5101 Primary insomnia: Secondary | ICD-10-CM | POA: Diagnosis not present

## 2023-08-13 DIAGNOSIS — D649 Anemia, unspecified: Secondary | ICD-10-CM | POA: Diagnosis not present

## 2023-08-13 DIAGNOSIS — R6882 Decreased libido: Secondary | ICD-10-CM | POA: Diagnosis not present

## 2023-08-13 DIAGNOSIS — R7303 Prediabetes: Secondary | ICD-10-CM | POA: Diagnosis not present

## 2023-08-13 DIAGNOSIS — E039 Hypothyroidism, unspecified: Secondary | ICD-10-CM | POA: Diagnosis not present

## 2023-08-13 DIAGNOSIS — Z6841 Body Mass Index (BMI) 40.0 and over, adult: Secondary | ICD-10-CM | POA: Diagnosis not present

## 2023-08-13 DIAGNOSIS — E559 Vitamin D deficiency, unspecified: Secondary | ICD-10-CM | POA: Diagnosis not present

## 2023-08-13 DIAGNOSIS — F419 Anxiety disorder, unspecified: Secondary | ICD-10-CM | POA: Diagnosis not present

## 2023-08-13 DIAGNOSIS — N951 Menopausal and female climacteric states: Secondary | ICD-10-CM | POA: Diagnosis not present

## 2023-08-20 DIAGNOSIS — Z6841 Body Mass Index (BMI) 40.0 and over, adult: Secondary | ICD-10-CM | POA: Diagnosis not present

## 2023-08-20 DIAGNOSIS — D649 Anemia, unspecified: Secondary | ICD-10-CM | POA: Diagnosis not present

## 2023-08-23 DIAGNOSIS — J45909 Unspecified asthma, uncomplicated: Secondary | ICD-10-CM | POA: Diagnosis not present

## 2023-08-28 DIAGNOSIS — Z6841 Body Mass Index (BMI) 40.0 and over, adult: Secondary | ICD-10-CM | POA: Diagnosis not present

## 2023-08-28 DIAGNOSIS — D649 Anemia, unspecified: Secondary | ICD-10-CM | POA: Diagnosis not present

## 2023-10-09 DIAGNOSIS — H5213 Myopia, bilateral: Secondary | ICD-10-CM | POA: Diagnosis not present

## 2023-10-17 DIAGNOSIS — M6258 Muscle wasting and atrophy, not elsewhere classified, other site: Secondary | ICD-10-CM | POA: Diagnosis not present

## 2023-10-17 DIAGNOSIS — M62838 Other muscle spasm: Secondary | ICD-10-CM | POA: Diagnosis not present

## 2023-10-17 DIAGNOSIS — R1084 Generalized abdominal pain: Secondary | ICD-10-CM | POA: Diagnosis not present

## 2023-10-17 DIAGNOSIS — N3946 Mixed incontinence: Secondary | ICD-10-CM | POA: Diagnosis not present

## 2023-10-31 DIAGNOSIS — M6258 Muscle wasting and atrophy, not elsewhere classified, other site: Secondary | ICD-10-CM | POA: Diagnosis not present

## 2023-10-31 DIAGNOSIS — M62838 Other muscle spasm: Secondary | ICD-10-CM | POA: Diagnosis not present

## 2023-10-31 DIAGNOSIS — N3946 Mixed incontinence: Secondary | ICD-10-CM | POA: Diagnosis not present

## 2023-10-31 DIAGNOSIS — R1084 Generalized abdominal pain: Secondary | ICD-10-CM | POA: Diagnosis not present

## 2023-11-14 DIAGNOSIS — M62838 Other muscle spasm: Secondary | ICD-10-CM | POA: Diagnosis not present

## 2023-11-14 DIAGNOSIS — M6258 Muscle wasting and atrophy, not elsewhere classified, other site: Secondary | ICD-10-CM | POA: Diagnosis not present

## 2023-11-14 DIAGNOSIS — R1084 Generalized abdominal pain: Secondary | ICD-10-CM | POA: Diagnosis not present

## 2023-11-14 DIAGNOSIS — N3946 Mixed incontinence: Secondary | ICD-10-CM | POA: Diagnosis not present

## 2024-03-25 DIAGNOSIS — I517 Cardiomegaly: Secondary | ICD-10-CM | POA: Diagnosis not present

## 2024-03-25 DIAGNOSIS — I10 Essential (primary) hypertension: Secondary | ICD-10-CM | POA: Diagnosis not present

## 2024-08-21 ENCOUNTER — Telehealth (INDEPENDENT_AMBULATORY_CARE_PROVIDER_SITE_OTHER): Payer: Self-pay | Admitting: Primary Care

## 2024-08-21 NOTE — Telephone Encounter (Signed)
 Left VM with pt about their upcoming appt. Pt did not answer

## 2024-08-28 ENCOUNTER — Telehealth: Payer: Self-pay | Admitting: Primary Care

## 2024-08-28 NOTE — Telephone Encounter (Signed)
 Left VM with pt about their upcoming appt. Pt did not answer

## 2024-09-01 ENCOUNTER — Encounter (INDEPENDENT_AMBULATORY_CARE_PROVIDER_SITE_OTHER): Admitting: Primary Care

## 2024-09-21 ENCOUNTER — Encounter: Admitting: Primary Care
